# Patient Record
Sex: Male | Born: 2000
Health system: Southern US, Community
[De-identification: ages and names within clinical notes are randomized; demographics above are authoritative.]

## PROBLEM LIST (undated history)

## (undated) DIAGNOSIS — F329 Major depressive disorder, single episode, unspecified: Secondary | ICD-10-CM

## (undated) DIAGNOSIS — F32A Depression, unspecified: Secondary | ICD-10-CM

## (undated) DIAGNOSIS — F12188 Cannabis abuse with other cannabis-induced disorder: Secondary | ICD-10-CM

## (undated) DIAGNOSIS — E669 Obesity, unspecified: Secondary | ICD-10-CM

## (undated) DIAGNOSIS — J45909 Unspecified asthma, uncomplicated: Secondary | ICD-10-CM

## (undated) DIAGNOSIS — E119 Type 2 diabetes mellitus without complications: Secondary | ICD-10-CM

## (undated) DIAGNOSIS — F419 Anxiety disorder, unspecified: Secondary | ICD-10-CM

## (undated) DIAGNOSIS — F4310 Post-traumatic stress disorder, unspecified: Secondary | ICD-10-CM

## (undated) DIAGNOSIS — R440 Auditory hallucinations: Secondary | ICD-10-CM

## (undated) HISTORY — PX: FRACTURE SURGERY: SHX138

---
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
## 2021-01-08 ENCOUNTER — Other Ambulatory Visit (INDEPENDENT_AMBULATORY_CARE_PROVIDER_SITE_OTHER): Payer: Self-pay | Admitting: "Endocrinology

## 2021-01-08 ENCOUNTER — Other Ambulatory Visit: Payer: Self-pay | Admitting: Family Medicine

## 2021-01-08 DIAGNOSIS — E1065 Type 1 diabetes mellitus with hyperglycemia: Secondary | ICD-10-CM

## 2021-01-08 DIAGNOSIS — IMO0002 Reserved for concepts with insufficient information to code with codable children: Secondary | ICD-10-CM

## 2021-02-01 ENCOUNTER — Telehealth (INDEPENDENT_AMBULATORY_CARE_PROVIDER_SITE_OTHER): Payer: Self-pay

## 2021-02-01 ENCOUNTER — Ambulatory Visit (INDEPENDENT_AMBULATORY_CARE_PROVIDER_SITE_OTHER): Payer: Medicaid Other | Admitting: "Endocrinology

## 2021-02-01 NOTE — Progress Notes (Deleted)
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 checking his BGs.  3. Dehydration: He is reasonably hydrated today,  4-5. Goiter/thyroiditis: His thyroid gland is less enlarged today and his thyroiditis is clinically quiescent. He was euthyroid in February 2021. It was likely that he would follow in the footsteps of his maternal grandmother and become hypothyroid in the future. We need to repeat his TFTs. 6-7. Growth delay, physical/unintentional weight loss: His height growth has plateaued. His weight is unchanged.  8. Tinea pedis: His tinea is still active. He needs to use the ketoconazole every day.   9-12. Adjustment reaction/hallucinations/deliberate self-cutting/PTSD:  These problems had been better after starting psych meds and undergoing counseling. Unfortunately, he stopped taking those medications. He says that when he sees his new psychiatrist, he will take whatever medications are prescribed. 13. Peripheral neuropathy: He has no peripheral neuropathy today. 14. Autonomic neuropathy: He still has gastroparesis and post-prandial bloating.  15. Ketonuria: He has ketosis and ketonuria.   PLAN:  1. Diagnostic: TFTs, CBC, microalbumin/creatinine ratio. Send in BG report in 2 days and again in 2 weeks 2. Therapeutic: Take 40 units of Lantus and follow his Novolog 120/30/12 plan.  3. Patient education: We discussed all of the above, to include the need to take better control of his BG. .  4. Follow-up: 2 months  Level of Service: This visit lasted in excess of 90 minutes. More than 50% of the visit was devoted to counseling.   Tillman Sers, MD, CDE Pediatric and Adult  Endocrinology

## 2021-02-01 NOTE — Telephone Encounter (Signed)
Patient had an appointment scheduled to see Dr. Fransico Michael today, but showed up to the office well after the scheduled time. While rescheduling the appointment patient mentioned to the front desk staff that he needed a new meter.  This medical assistant spoke to the patient and he informs that he does not feel that his meter is accurately recording his glucose levels. States he bought a meter from the pharmacy to use until he could come see [place]. Asked patient if he brought his meters with him so we may download. Patient provided meter (did not have the meter he felt was working inadequately), but this was not a meter we were able to download.  Patient was provided a new meter and asked to bring all meters with him to his scheduled appointment so we may download and obtain al information.   Patient also signed a DPR as he is now over the age of 24.

## 2021-02-09 ENCOUNTER — Other Ambulatory Visit (INDEPENDENT_AMBULATORY_CARE_PROVIDER_SITE_OTHER): Payer: Self-pay | Admitting: "Endocrinology

## 2021-02-09 NOTE — Progress Notes (Deleted)
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 taking large doses of insulin haphazardly without checking his BGs.  3. Dehydration: He is reasonably hydrated today,  4-5. Goiter/thyroiditis: His thyroid gland is less enlarged today and his thyroiditis is clinically quiescent. He was euthyroid in February 2021. It was likely that he would follow in the footsteps of his maternal grandmother and become hypothyroid in the future. We need to repeat his TFTs. 6-7. Growth delay, physical/unintentional weight loss: His height growth has plateaued. His weight is unchanged.  8. Tinea pedis: His tinea is still active. He needs to use the ketoconazole every day.   9-12. Adjustment reaction/hallucinations/deliberate self-cutting/PTSD:  These problems had been better after starting psych meds and undergoing counseling. Unfortunately, he stopped taking those medications. He says that when he sees his new psychiatrist, he will take whatever medications are prescribed. 13. Peripheral neuropathy: He has no peripheral neuropathy today. 14. Autonomic neuropathy: He still has gastroparesis and post-prandial bloating.  15. Ketonuria: He has ketosis and ketonuria.   PLAN:  1. Diagnostic: TFTs, CBC, microalbumin/creatinine ratio. Send in BG report in 2 days and again in 2 weeks 2. Therapeutic: Take 40 units of Lantus and follow his Novolog 120/30/12 plan.  3. Patient education: We discussed all of the above, to include the need to take better control of his BG. .  4. Follow-up: 2 months  Level of Service: This visit lasted in excess of 90 minutes. More than  50% of the visit was devoted to counseling.   Tillman Sers, MD, CDE [facility]

## 2021-02-10 ENCOUNTER — Ambulatory Visit (INDEPENDENT_AMBULATORY_CARE_PROVIDER_SITE_OTHER): Payer: Medicaid Other | Admitting: "Endocrinology

## 2021-02-21 ENCOUNTER — Telehealth (INDEPENDENT_AMBULATORY_CARE_PROVIDER_SITE_OTHER): Payer: Self-pay | Admitting: "Endocrinology

## 2021-02-21 NOTE — Telephone Encounter (Signed)
thanks

## 2021-02-21 NOTE — Telephone Encounter (Signed)
Who's calling (name and relationship to patient) : [name] (self)  Best contact number: [phone]  Provider they see: Dr. Fransico Michael  Reason for call:  Keith [name] called in stating that he has been having very high sugars. Last night/early this morning his meter was reading "High", over 500. States this has been continuous. His sugar currently is 126. Has not had any ketones in his urine, has had a headache (States he has a small bump on the side of his head that he has spoken with Dr. Fransico Michael regarding). His meter has not been working properly, checked on his main meter which was reading low but knew it was not accurate, tested on his backup meter which was reading High. Writer scheduled patient in first available with Dr. Fransico Michael which is 7/28. Please advise  Call ID:      PRESCRIPTION REFILL ONLY  Name of prescription:  Pharmacy:

## 2021-02-21 NOTE — Telephone Encounter (Signed)
Returned call: Spoke with Keith [name], checked sugar on old meter read low. Checked again on different meter that read 550. Checked every 30 min started going down. Started last night and went into this morning. His sugar now 126. Checked for Ketones. No ketones. Wants to make a appointment.

## 2021-04-13 NOTE — Progress Notes (Deleted)
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 Hypoglycemia: He reports having some low BG symptoms. In the past he has sometimes developed hypoglycemia after taking large doses of insulin haphazardly without checking his BGs.  3. Dehydration: He is reasonably hydrated today,  4-5. Goiter/thyroiditis: His thyroid gland is less enlarged today and his thyroiditis is clinically quiescent. He was euthyroid in February 2021. It was likely that he would follow in the footsteps of his maternal grandmother and become hypothyroid in the future. We need to repeat his TFTs. 6-7. Growth delay, physical/unintentional weight loss: His height growth has plateaued. His weight is unchanged.  8. Tinea pedis: His tinea is still active. He needs to use the ketoconazole every day.   9-12. Adjustment reaction/hallucinations/deliberate self-cutting/PTSD:  These problems had been better after starting psych meds and undergoing counseling. Unfortunately, he stopped taking those medications. He says that when he sees his new psychiatrist, he will take whatever medications are prescribed. 13. Peripheral neuropathy: He has no peripheral neuropathy today. 14. Autonomic neuropathy: He still has gastroparesis and post-prandial bloating.  15. Ketonuria: He has ketosis and ketonuria.   PLAN:  1. Diagnostic: TFTs, CBC, microalbumin/creatinine ratio. Send in BG report in 2 days and again in 2 weeks 2. Therapeutic: Take 40 units of Lantus and follow his Novolog 120/30/12 plan.  3. Patient education: We discussed all of the above, to include the need to take better control of his BG. .  4. Follow-up: 2 months  Level of Service: This visit lasted in excess of 90 minutes. More than 50% of the visit was devoted to counseling.   Tillman Sers, MD, CDE Pediatric and Adult Endocrinology

## 2021-04-14 ENCOUNTER — Ambulatory Visit (INDEPENDENT_AMBULATORY_CARE_PROVIDER_SITE_OTHER): Payer: Medicaid Other | Admitting: "Endocrinology

## 2021-04-29 ENCOUNTER — Inpatient Hospital Stay (HOSPITAL_COMMUNITY)
Admission: EM | Admit: 2021-04-29 | Discharge: 2021-05-02 | DRG: 638 | Disposition: A | Discharge status: Home / Self Care | Payer: Medicaid Other | Attending: Internal Medicine | Admitting: Internal Medicine

## 2021-04-29 ENCOUNTER — Other Ambulatory Visit: Payer: Self-pay

## 2021-04-29 ENCOUNTER — Encounter (HOSPITAL_COMMUNITY): Payer: Self-pay | Admitting: Emergency Medicine

## 2021-04-29 ENCOUNTER — Telehealth (INDEPENDENT_AMBULATORY_CARE_PROVIDER_SITE_OTHER): Payer: Self-pay | Admitting: "Endocrinology

## 2021-04-29 DIAGNOSIS — Z8249 Family history of ischemic heart disease and other diseases of the circulatory system: Secondary | ICD-10-CM

## 2021-04-29 DIAGNOSIS — F32A Depression, unspecified: Secondary | ICD-10-CM | POA: Diagnosis present

## 2021-04-29 DIAGNOSIS — E871 Hypo-osmolality and hyponatremia: Secondary | ICD-10-CM | POA: Diagnosis present

## 2021-04-29 DIAGNOSIS — E1010 Type 1 diabetes mellitus with ketoacidosis without coma: Secondary | ICD-10-CM | POA: Diagnosis not present

## 2021-04-29 DIAGNOSIS — IMO0002 Reserved for concepts with insufficient information to code with codable children: Secondary | ICD-10-CM

## 2021-04-29 DIAGNOSIS — D72829 Elevated white blood cell count, unspecified: Secondary | ICD-10-CM | POA: Diagnosis not present

## 2021-04-29 DIAGNOSIS — D72828 Other elevated white blood cell count: Secondary | ICD-10-CM | POA: Diagnosis present

## 2021-04-29 DIAGNOSIS — D75839 Thrombocytosis, unspecified: Secondary | ICD-10-CM

## 2021-04-29 DIAGNOSIS — D75838 Other thrombocytosis: Secondary | ICD-10-CM | POA: Diagnosis present

## 2021-04-29 DIAGNOSIS — Z818 Family history of other mental and behavioral disorders: Secondary | ICD-10-CM

## 2021-04-29 DIAGNOSIS — N179 Acute kidney failure, unspecified: Secondary | ICD-10-CM | POA: Diagnosis not present

## 2021-04-29 DIAGNOSIS — F4310 Post-traumatic stress disorder, unspecified: Secondary | ICD-10-CM | POA: Diagnosis present

## 2021-04-29 DIAGNOSIS — E860 Dehydration: Secondary | ICD-10-CM | POA: Diagnosis present

## 2021-04-29 DIAGNOSIS — Z794 Long term (current) use of insulin: Secondary | ICD-10-CM

## 2021-04-29 DIAGNOSIS — E1110 Type 2 diabetes mellitus with ketoacidosis without coma: Secondary | ICD-10-CM | POA: Diagnosis present

## 2021-04-29 DIAGNOSIS — Z20822 Contact with and (suspected) exposure to covid-19: Secondary | ICD-10-CM | POA: Diagnosis present

## 2021-04-29 DIAGNOSIS — Z809 Family history of malignant neoplasm, unspecified: Secondary | ICD-10-CM

## 2021-04-29 DIAGNOSIS — E1065 Type 1 diabetes mellitus with hyperglycemia: Secondary | ICD-10-CM

## 2021-04-29 DIAGNOSIS — Z833 Family history of diabetes mellitus: Secondary | ICD-10-CM

## 2021-04-29 LAB — I-STAT VENOUS BLOOD GAS, ED
Acid-base deficit: 11 mmol/L — ABNORMAL HIGH (ref 0.0–2.0)
Bicarbonate: 13 mmol/L — ABNORMAL LOW (ref 20.0–28.0)
Calcium, Ion: 1.27 mmol/L (ref 1.15–1.40)
HCT: 54 % — ABNORMAL HIGH (ref 39.0–52.0)
Hemoglobin: 18.4 g/dL — ABNORMAL HIGH (ref 13.0–17.0)
O2 Saturation: 87 %
Potassium: 4.8 mmol/L (ref 3.5–5.1)
Sodium: 123 mmol/L — ABNORMAL LOW (ref 135–145)
TCO2: 14 mmol/L — ABNORMAL LOW (ref 22–32)
pCO2, Ven: 25.7 mmHg — ABNORMAL LOW (ref 44.0–60.0)
pH, Ven: 7.312 (ref 7.250–7.430)
pO2, Ven: 56 mmHg — ABNORMAL HIGH (ref 32.0–45.0)

## 2021-04-29 LAB — COMPREHENSIVE METABOLIC PANEL
ALT: 17 U/L (ref 0–44)
AST: 13 U/L — ABNORMAL LOW (ref 15–41)
Albumin: 4.3 g/dL (ref 3.5–5.0)
Alkaline Phosphatase: 86 U/L (ref 38–126)
Anion gap: 18 — ABNORMAL HIGH (ref 5–15)
BUN: 17 mg/dL (ref 6–20)
CO2: 14 mmol/L — ABNORMAL LOW (ref 22–32)
Calcium: 10.3 mg/dL (ref 8.9–10.3)
Chloride: 91 mmol/L — ABNORMAL LOW (ref 98–111)
Creatinine, Ser: 1.12 mg/dL (ref 0.61–1.24)
GFR, Estimated: >60 mL/min (ref >60)
Glucose, Bld: 440 mg/dL — ABNORMAL HIGH (ref 70–99)
Potassium: 4.8 mmol/L (ref 3.5–5.1)
Sodium: 123 mmol/L — ABNORMAL LOW (ref 135–145)
Total Bilirubin: 2 mg/dL — ABNORMAL HIGH (ref 0.3–1.2)
Total Protein: 7.1 g/dL (ref 6.5–8.1)

## 2021-04-29 LAB — RESP PANEL BY RT-PCR (FLU A&B, COVID) ARPGX2
Influenza A by PCR: NEGATIVE
Influenza B by PCR: NEGATIVE
SARS Coronavirus 2 by RT PCR: NEGATIVE

## 2021-04-29 LAB — BASIC METABOLIC PANEL
Anion gap: 20 — ABNORMAL HIGH (ref 5–15)
Anion gap: 11 (ref 5–15)
BUN: 18 mg/dL (ref 6–20)
BUN: 14 mg/dL (ref 6–20)
CO2: 8 mmol/L — ABNORMAL LOW (ref 22–32)
CO2: 15 mmol/L — ABNORMAL LOW (ref 22–32)
Calcium: 8.9 mg/dL (ref 8.9–10.3)
Calcium: 8.8 mg/dL — ABNORMAL LOW (ref 8.9–10.3)
Chloride: 96 mmol/L — ABNORMAL LOW (ref 98–111)
Chloride: 101 mmol/L (ref 98–111)
Creatinine, Ser: 1.16 mg/dL (ref 0.61–1.24)
Creatinine, Ser: 1.04 mg/dL (ref 0.61–1.24)
GFR, Estimated: >60 mL/min (ref >60)
GFR, Estimated: >60 mL/min (ref >60)
Glucose, Bld: 385 mg/dL — ABNORMAL HIGH (ref 70–99)
Glucose, Bld: 248 mg/dL — ABNORMAL HIGH (ref 70–99)
Potassium: 5.6 mmol/L — ABNORMAL HIGH (ref 3.5–5.1)
Potassium: 3.8 mmol/L (ref 3.5–5.1)
Sodium: 124 mmol/L — ABNORMAL LOW (ref 135–145)
Sodium: 127 mmol/L — ABNORMAL LOW (ref 135–145)

## 2021-04-29 LAB — CBC WITH DIFFERENTIAL/PLATELET
Abs Immature Granulocytes: 0.14 10*3/uL — ABNORMAL HIGH (ref 0.00–0.07)
Basophils Absolute: 0.1 10*3/uL (ref 0.0–0.1)
Basophils Relative: 1 %
Eosinophils Absolute: 0 10*3/uL (ref 0.0–0.5)
Eosinophils Relative: 0 %
HCT: 49.1 % (ref 39.0–52.0)
Hemoglobin: 16.4 g/dL (ref 13.0–17.0)
Immature Granulocytes: 1 %
Lymphocytes Relative: 9 %
Lymphs Abs: 1 10*3/uL (ref 0.7–4.0)
MCH: 28.3 pg (ref 26.0–34.0)
MCHC: 33.4 g/dL (ref 30.0–36.0)
MCV: 84.7 fL (ref 80.0–100.0)
Monocytes Absolute: 1.3 10*3/uL — ABNORMAL HIGH (ref 0.1–1.0)
Monocytes Relative: 12 %
Neutro Abs: 8.4 10*3/uL — ABNORMAL HIGH (ref 1.7–7.7)
Neutrophils Relative %: 77 %
Platelets: 402 10*3/uL — ABNORMAL HIGH (ref 150–400)
RBC: 5.8 MIL/uL (ref 4.22–5.81)
RDW: 13 % (ref 11.5–15.5)
WBC: 11 10*3/uL — ABNORMAL HIGH (ref 4.0–10.5)
nRBC: 0 % (ref 0.0–0.2)

## 2021-04-29 LAB — URINALYSIS, ROUTINE W REFLEX MICROSCOPIC
Bacteria, UA: NONE SEEN
Bilirubin Urine: NEGATIVE
Glucose, UA: >=500 mg/dL — AB
Hgb urine dipstick: NEGATIVE
Ketones, ur: 80 mg/dL — AB
Leukocytes,Ua: NEGATIVE
Nitrite: NEGATIVE
Protein, ur: NEGATIVE mg/dL
Specific Gravity, Urine: 1.015 (ref 1.005–1.030)
pH: 5 (ref 5.0–8.0)

## 2021-04-29 LAB — BETA-HYDROXYBUTYRIC ACID
Beta-Hydroxybutyric Acid: 2.26 mmol/L — ABNORMAL HIGH (ref 0.05–0.27)
Beta-Hydroxybutyric Acid: >8 mmol/L — ABNORMAL HIGH (ref 0.05–0.27)

## 2021-04-29 LAB — CBG MONITORING, ED
Glucose-Capillary: 407 mg/dL — ABNORMAL HIGH (ref 70–99)
Glucose-Capillary: 366 mg/dL — ABNORMAL HIGH (ref 70–99)
Glucose-Capillary: 307 mg/dL — ABNORMAL HIGH (ref 70–99)
Glucose-Capillary: 267 mg/dL — ABNORMAL HIGH (ref 70–99)

## 2021-04-29 LAB — LIPASE, BLOOD: Lipase: 28 U/L (ref 11–51)

## 2021-04-29 LAB — GLUCOSE, CAPILLARY: Glucose-Capillary: 246 mg/dL — ABNORMAL HIGH (ref 70–99)

## 2021-04-29 MED ORDER — DEXTROSE IN LACTATED RINGERS 5 % IV SOLN: INTRAVENOUS | Status: DC

## 2021-04-29 MED ORDER — ENOXAPARIN SODIUM 40 MG/0.4ML IJ SOSY
40.0000 mg | PREFILLED_SYRINGE | INTRAMUSCULAR | Status: DC
  Administered 2021-04-29 – 2021-05-01 (×3): 40 mg via SUBCUTANEOUS
  Filled 2021-04-29 (×3): qty 0.4

## 2021-04-29 MED ORDER — METOCLOPRAMIDE HCL 5 MG/ML IJ SOLN
10.0000 mg | Freq: Once | INTRAMUSCULAR | Status: AC
  Administered 2021-04-29: 10 mg via INTRAVENOUS
  Filled 2021-04-29: qty 2

## 2021-04-29 MED ORDER — DEXTROSE 50 % IV SOLN: 0.0000 mL | INTRAVENOUS | Status: DC | PRN

## 2021-04-29 MED ORDER — INSULIN REGULAR(HUMAN) IN NACL 100-0.9 UT/100ML-% IV SOLN
INTRAVENOUS | Status: DC
  Administered 2021-04-29: 7.5 [IU]/h via INTRAVENOUS
  Filled 2021-04-29: qty 100

## 2021-04-29 MED ORDER — LACTATED RINGERS IV SOLN: INTRAVENOUS | Status: DC

## 2021-04-29 MED ORDER — LACTATED RINGERS IV BOLUS
1000.0000 mL | Freq: Once | INTRAVENOUS | Status: AC
  Administered 2021-04-29: 1000 mL via INTRAVENOUS

## 2021-04-29 MED ORDER — POTASSIUM CHLORIDE 10 MEQ/100ML IV SOLN
10.0000 meq | INTRAVENOUS | Status: AC
  Administered 2021-04-29 (×2): 10 meq via INTRAVENOUS
  Filled 2021-04-29 (×2): qty 100

## 2021-04-29 NOTE — ED Notes (Signed)
cbg 407
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
## 2021-04-29 NOTE — ED Triage Notes (Signed)
Pt here from home with c/o abd  pain and n/v ,pt does admit to smoking some weed 2 days ago .

## 2021-04-29 NOTE — Telephone Encounter (Signed)
  Who's calling (name and relationship to patient) :[name]   Best contact number:[phone]  Provider they see:Dr. Fransico Michael   Reason for call:Caller stated that she wanted to call in and let Dr. Fransico Michael know that they are on the way to take Jaxn to [facility] and he will probably be admitted per [facility]... Caller stated that he wont eat, he has been throwing up, and slepping a lot, and has a infection inside his mouth. [name] stated that is health overall is not good and not being cared for like it should. Please advise      PRESCRIPTION REFILL ONLY  Name of prescription:  Pharmacy:

## 2021-04-29 NOTE — H&P (Signed)
History and Physical   Keith [name] JXB:[id number] DOB: 08-Nov-2000 DOA: 04/29/2021  PCP: Aleen Campi, DO   Patient coming from: Home  Chief Complaint: Nausea, vomiting, abdominal pain, fatigue, elevated blood glucose  HPI: Keith [name] is a 20 y.o. male with medical history significant of diabetes type 1, anxiety, depression, PTSD who presents with ongoing nausea vomiting abdominal pain and fatigue. As above patient has had 2 days of nausea, vomiting, abdominal pain, fatigue.  He has noticed that his blood sugars have been running in the 400s.  He states he is trying to eat but gets nausea and vomits whenever he tries to.  He states he has been taking his insulin and has been trying to follow his diabetic diet. He denies fevers, chills, chest pain, shortness of breath, constipation, diarrhea.   ED Course: Vital signs in the ED significant for initial heart rate in the 120s now improved to the 100s.  Lab work-up showed CMP with sodium of 123 which improved to 131 when you account for glucose of 440.  Potassium 4.8, chloride 91, bicarb 14, gap of 18, creatinine 1.12 up from baseline of around 0.7.  T bili 2.  CBC with leukocytosis to 11 and platelets of 4 2.  Lipase normal.  Respiratory panel for flu and COVID pending.  Beta hydroxybutyric acid elevated at 2.26.  Urinalysis pending.  VBG with pH of 7.31 and PCO2 of 25.  Patient started on insulin drip, IV fluids, Reglan and potassium in the ED.  Review of Systems: As per HPI otherwise all other systems reviewed and are negative.  Past Medical History:  Diagnosis Date   Anxiety    Asthma    Auditory hallucinations    Depression    Diabetes mellitus without complication ([place]) 13/04/6577   New onset   Obesity    PTSD (post-traumatic stress disorder)    Social problem 08/03/2015   Unintentional weight loss 06/07/2017    Past Surgical History:  Procedure Laterality Date   FRACTURE SURGERY     Right arm    Social  History  reports that he has never smoked. He has never used smokeless tobacco. He reports current drug use. Drug: Marijuana. He reports that he does not drink alcohol.  Allergies  Allergen Reactions   Amoxicillin Hives and Rash   Penicillins Hives and Rash    Has patient had a PCN reaction causing immediate rash, facial/tongue/throat swelling, SOB or lightheadedness with hypotension: Yes Has patient had a PCN reaction causing severe rash involving mucus membranes or skin necrosis: Yes Has patient had a PCN reaction that required hospitalization: No Has patient had a PCN reaction occurring within the last 10 years: Yes If all of the above answers are "NO", then may proceed with Cephalosporin use.     Family History  Problem Relation Age of Onset   Diabetes Mother    Anxiety disorder Mother    Hypothyroidism Maternal Grandmother    Cancer Maternal Grandmother    Heart disease Maternal Grandmother    Cancer Maternal Grandfather    Hyperlipidemia Maternal Grandfather   Reviewed on admission  Prior to Admission medications   Medication Sig Start Date End Date Taking? Authorizing Provider  Accu-Chek FastClix Lancets MISC TEST SIX TIMES DAILY 01/10/21   Sherrlyn Hock, MD  BD PEN NEEDLE NANO 2ND GEN 32G X 4 MM MISC USE 1 NEEDLE SIX TIMES DAILY 01/10/21   Sherrlyn Hock, MD  Blood Glucose Monitoring Suppl (ACCU-CHEK GUIDE) w/Device KIT 1  Units by Does not apply route daily. Patient not taking: No sig reported 05/04/17   Sherrlyn Hock, MD  Blood Glucose Monitoring Suppl (ACCU-CHEK GUIDE) w/Device KIT Use to check blood glucose 8 times a day Patient not taking: Reported on 11/30/2020 09/16/20   Sherrlyn Hock, MD  Glucagon (BAQSIMI TWO PACK) 3 MG/DOSE POWD Place 1 application into the nose as needed. Use as directed if unconscious, unable to take food po, or having a seizure due to hypoglycemia Patient not taking: Reported on 11/30/2020 02/04/20   Levon Hedger, MD   glucagon (GLUCAGON EMERGENCY) 1 MG injection INJECT 1 MG IN THE MUSCLE OF THIGH IF UNRESPONSIVE, UNABLE TO SWALLOW, UNCONSCIOUS AND/OR HAS SEIZURE Patient not taking: Reported on 11/30/2020 08/11/19   Sherrlyn Hock, MD  glucose blood (ACCU-CHEK GUIDE) test strip Test blood sugar 8 times daily Patient not taking: Reported on 11/30/2020 09/16/20   Sherrlyn Hock, MD  insulin aspart (NOVOLOG FLEXPEN) 100 UNIT/ML FlexPen Use up to 50 units daily 02/09/21   Sherrlyn Hock, MD  insulin aspart (NOVOLOG) 100 UNIT/ML injection Inject into skin 3 times daily before meals per sliding scale 09/16/20   Sherrlyn Hock, MD  insulin glargine (LANTUS SOLOSTAR) 100 UNIT/ML Solostar Pen Inject up to 50 units per day 05/31/20   Sherrlyn Hock, MD  ketoconazole (NIZORAL) 2 % cream APPLY TO FEET TWICE DAILY FOR 1 MONTH THEN ONCE DAILY UNTIL FUNGUS CLEARS COMPLETELY 02/09/21   Sherrlyn Hock, MD  Urine Glucose-Ketones Test STRP 1 each by Other route as needed. Patient not taking: No sig reported 09/08/15   Lelon Huh, MD    Physical Exam: Vitals:   04/29/21 1815 04/29/21 1830 04/29/21 1845 04/29/21 1930  BP: 120/89 122/86 112/76 107/75  Pulse: (!) 106 (!) 102 (!) 106 (!) 103  Resp: '15 15 19 14  ' Temp:      TempSrc:      SpO2: 99% 100% 100% 100%   Physical Exam Constitutional:      General: He is not in acute distress.    Comments: Lethargic/tired appearing young male  HENT:     Head: Normocephalic and atraumatic.     Mouth/Throat:     Mouth: Mucous membranes are moist.     Pharynx: Oropharynx is clear.  Eyes:     Extraocular Movements: Extraocular movements intact.     Pupils: Pupils are equal, round, and reactive to light.  Cardiovascular:     Rate and Rhythm: Regular rhythm. Tachycardia present.     Pulses: Normal pulses.     Heart sounds: Normal heart sounds.  Pulmonary:     Effort: Pulmonary effort is normal. No respiratory distress.     Breath sounds: Normal breath  sounds.  Abdominal:     General: Bowel sounds are normal. There is no distension.     Palpations: Abdomen is soft.     Tenderness: There is no abdominal tenderness.  Musculoskeletal:        General: No swelling or deformity.  Skin:    General: Skin is warm and dry.  Neurological:     General: No focal deficit present.     Mental Status: Mental status is at baseline.   Labs on Admission: I have personally reviewed following labs and imaging studies  CBC: Recent Labs  Lab 04/29/21 1710 04/29/21 1732  WBC 11.0*  --   NEUTROABS 8.4*  --   HGB 16.4 18.4*  HCT 49.1 54.0*  MCV 84.7  --  PLT 402*  --     Basic Metabolic Panel: Recent Labs  Lab 04/29/21 1710 04/29/21 1732  NA 123* 123*  K 4.8 4.8  CL 91*  --   CO2 14*  --   GLUCOSE 440*  --   BUN 17  --   CREATININE 1.12  --   CALCIUM 10.3  --     GFR: CrCl cannot be calculated (Unknown ideal weight.).  Liver Function Tests: Recent Labs  Lab 04/29/21 1710  AST 13*  ALT 17  ALKPHOS 86  BILITOT 2.0*  PROT 7.1  ALBUMIN 4.3    Urine analysis:    Component Value Date/Time   COLORURINE YELLOW 11/28/2020 1241   APPEARANCEUR CLEAR 11/28/2020 1241   LABSPEC 1.037 (H) 11/28/2020 1241   PHURINE 5.0 11/28/2020 1241   GLUCOSEU >=500 (A) 11/28/2020 1241   HGBUR NEGATIVE 11/28/2020 1241   BILIRUBINUR NEGATIVE 11/28/2020 1241   KETONESUR 80 (A) 11/28/2020 1241   PROTEINUR Negative 11/30/2020 1010   PROTEINUR NEGATIVE 11/28/2020 1241   UROBILINOGEN 1.0 08/07/2013 1528   NITRITE NEGATIVE 11/28/2020 1241   LEUKOCYTESUR NEGATIVE 11/28/2020 1241    Radiological Exams on Admission: No results found.  EKG: Independently reviewed.  Sinus tachycardia at 105 bpm.  Assessment/Plan Principal Problem:   DKA (diabetic ketoacidosis) ([place]) Active Problems:   AKI (acute kidney injury) ([place])   Hyponatremia   Leukocytosis   Thrombocytosis  DKA > Type I diabetic with 2 days of nausea, vomiting, abdominal pain and  fatigue with home blood sugars run in the 400s. > Reports he is continue take his insulin and following a diabetic diet.  He continues follows with pediatric endocrinologist Dr. Signa Kell.  Reports home insulin dose is 40 units in the evening. > Glucose in the ED 440 with bicarb of 14 and gap of 18.  Beta hydroxybutyric acid elevated to 2.2.6. > Started on DKA protocol in ED including insulin drip, potassium, trending labs. - Admit to progressive - Continue insulin drip per protocol - Potassium supplementation ongoing in the ED, 20 mEq IV over 2 hours - Additional 1L bolus. LR at 125 mL/hr until CBG less than 250 - Switch to D5-LR when 1 CBG less than 250 - Nothing by mouth  - BMET every 4 hours - CBG Q1H - Once anion gap closed 2, start CM diet and if able to eat, administer Lantus 30 units - Continue insulin drip for 1-2 more hours, then discontinue and start SSI-S  - DC fluids if eating, drinking, and off insulin drip  Hyponatremia AKI > Sodium 123 which when corrected for glucose of 440 is still mildly low at 131.  Likely due to dehydration. > Creatinine 1.12 up from 0.7 which thinks her trigger for AKI.  Also in the setting of dehydration and DKA. - Expect sodium to improve with IV fluids. - Continue with IV fluids - Avoid nephrotoxic agents - Trend renal function and electrolytes  Leukocytosis Thrombocytosis > Hemoglobin 11 and platelets greater than 400.  Suspect both are reactive secondary to DKA. - Continue to monitor  DVT prophylaxis: Lovenox  Code Status:   Full  Family Communication:  Grandfather updated at bedside Disposition Plan:   Patient is from:  Home  Anticipated DC to:  Home  Anticipated DC date:  1 to 2 days  Anticipated DC barriers: None  Consults called:  None Admission status:  Observation, progressive   Severity of Illness: The appropriate patient status for this patient is OBSERVATION. Observation status is  judged to be reasonable and necessary in  order to provide the required intensity of service to ensure the patient's safety. The patient's presenting symptoms, physical exam findings, and initial radiographic and laboratory data in the context of their medical condition is felt to place them at decreased risk for further clinical deterioration. Furthermore, it is anticipated that the patient will be medically stable for discharge from the hospital within 2 midnights of admission. The following factors support the patient status of observation.   " The patient's presenting symptoms include nausea, vomiting, abdominal pain, fatigue. " The physical exam findings include tachycardia, lethargy. " The initial radiographic and laboratory data are  Lab work-up showed CMP with sodium of 123 which improved to 131 when you account for glucose of 440.  Potassium 4.8, chloride 91, bicarb 14, gap of 18, creatinine 1.12 up from baseline of around 0.7.  T bili 2.  CBC with leukocytosis to 11 and platelets of 4 2.  Lipase normal.  Respiratory panel for flu and COVID pending.  Beta hydroxybutyric acid elevated at 2.26.  Urinalysis pending.  VBG with pH of 7.31 and PCO2 of 25.   Marcelyn Bruins MD [facility]  How to contact the [facility] Attending or Consulting provider [place] or covering provider during after hours [place], for this patient?   Check the care team in [facility] and look for a) attending/consulting TRH provider listed and b) the [facility] team listed Log into [email or web address] and use [place]'s universal password to access. If you do not have the password, please contact the hospital operator. Locate the [facility] provider you are looking for under [facility] and page to a number that you can be directly reached. If you still have difficulty reaching the provider, please page the [employer] (Director on Call) for the Hospitalists listed on amion for assistance.  04/29/2021, 8:12 PM

## 2021-04-29 NOTE — ED Provider Notes (Signed)
Emergency Medicine Provider Triage Evaluation Note  Keith [name] , a 20 y.o. male  was evaluated in triage.  Pt complains of nv for 2 days. Also c/o diffuse abd pain.  Review of Systems  Positive: Abd pain, nv Negative: fever  Physical Exam  There were no vitals taken for this visit. Gen:   Awake, no distress   Resp:  Normal effort  MSK:   Moves extremities without difficulty  Other:  Diffuse abd discomfort  Medical Decision Making  Medically screening exam initiated at 4:58 PM.  Appropriate orders placed.  Jerris Fleer was informed that the remainder of the evaluation will be completed by another provider, this initial triage assessment does not replace that evaluation, and the importance of remaining in the ED until their evaluation is complete.     Rayne Du 04/29/21 1707    Terrilee Files, MD 04/30/21 1102

## 2021-04-30 DIAGNOSIS — Z8249 Family history of ischemic heart disease and other diseases of the circulatory system: Secondary | ICD-10-CM | POA: Diagnosis not present

## 2021-04-30 DIAGNOSIS — N179 Acute kidney failure, unspecified: Secondary | ICD-10-CM | POA: Diagnosis present

## 2021-04-30 DIAGNOSIS — Z20822 Contact with and (suspected) exposure to covid-19: Secondary | ICD-10-CM | POA: Diagnosis present

## 2021-04-30 DIAGNOSIS — Z818 Family history of other mental and behavioral disorders: Secondary | ICD-10-CM | POA: Diagnosis not present

## 2021-04-30 DIAGNOSIS — E860 Dehydration: Secondary | ICD-10-CM | POA: Diagnosis present

## 2021-04-30 DIAGNOSIS — F4310 Post-traumatic stress disorder, unspecified: Secondary | ICD-10-CM | POA: Diagnosis present

## 2021-04-30 DIAGNOSIS — F32A Depression, unspecified: Secondary | ICD-10-CM | POA: Diagnosis present

## 2021-04-30 DIAGNOSIS — E1010 Type 1 diabetes mellitus with ketoacidosis without coma: Secondary | ICD-10-CM | POA: Diagnosis present

## 2021-04-30 DIAGNOSIS — D75838 Other thrombocytosis: Secondary | ICD-10-CM | POA: Diagnosis present

## 2021-04-30 DIAGNOSIS — Z833 Family history of diabetes mellitus: Secondary | ICD-10-CM | POA: Diagnosis not present

## 2021-04-30 DIAGNOSIS — D72828 Other elevated white blood cell count: Secondary | ICD-10-CM | POA: Diagnosis present

## 2021-04-30 DIAGNOSIS — Z809 Family history of malignant neoplasm, unspecified: Secondary | ICD-10-CM | POA: Diagnosis not present

## 2021-04-30 DIAGNOSIS — Z794 Long term (current) use of insulin: Secondary | ICD-10-CM | POA: Diagnosis not present

## 2021-04-30 DIAGNOSIS — E871 Hypo-osmolality and hyponatremia: Secondary | ICD-10-CM | POA: Diagnosis present

## 2021-04-30 LAB — GLUCOSE, CAPILLARY
Glucose-Capillary: 126 mg/dL — ABNORMAL HIGH (ref 70–99)
Glucose-Capillary: 136 mg/dL — ABNORMAL HIGH (ref 70–99)
Glucose-Capillary: 140 mg/dL — ABNORMAL HIGH (ref 70–99)
Glucose-Capillary: 141 mg/dL — ABNORMAL HIGH (ref 70–99)
Glucose-Capillary: 163 mg/dL — ABNORMAL HIGH (ref 70–99)
Glucose-Capillary: 165 mg/dL — ABNORMAL HIGH (ref 70–99)
Glucose-Capillary: 170 mg/dL — ABNORMAL HIGH (ref 70–99)
Glucose-Capillary: 341 mg/dL — ABNORMAL HIGH (ref 70–99)
Glucose-Capillary: 342 mg/dL — ABNORMAL HIGH (ref 70–99)
Glucose-Capillary: 344 mg/dL — ABNORMAL HIGH (ref 70–99)
Glucose-Capillary: 375 mg/dL — ABNORMAL HIGH (ref 70–99)
Glucose-Capillary: 415 mg/dL — ABNORMAL HIGH (ref 70–99)

## 2021-04-30 LAB — BASIC METABOLIC PANEL
Anion gap: 7 (ref 5–15)
Anion gap: 5 (ref 5–15)
Anion gap: 6 (ref 5–15)
BUN: 10 mg/dL (ref 6–20)
BUN: 11 mg/dL (ref 6–20)
BUN: 12 mg/dL (ref 6–20)
CO2: 21 mmol/L — ABNORMAL LOW (ref 22–32)
CO2: 22 mmol/L (ref 22–32)
CO2: 23 mmol/L (ref 22–32)
Calcium: 8.7 mg/dL — ABNORMAL LOW (ref 8.9–10.3)
Calcium: 8.8 mg/dL — ABNORMAL LOW (ref 8.9–10.3)
Calcium: 8.4 mg/dL — ABNORMAL LOW (ref 8.9–10.3)
Chloride: 104 mmol/L (ref 98–111)
Chloride: 105 mmol/L (ref 98–111)
Chloride: 102 mmol/L (ref 98–111)
Creatinine, Ser: 0.69 mg/dL (ref 0.61–1.24)
Creatinine, Ser: 0.6 mg/dL — ABNORMAL LOW (ref 0.61–1.24)
Creatinine, Ser: 0.62 mg/dL (ref 0.61–1.24)
GFR, Estimated: >60 mL/min (ref >60)
GFR, Estimated: >60 mL/min (ref >60)
GFR, Estimated: >60 mL/min (ref >60)
Glucose, Bld: 143 mg/dL — ABNORMAL HIGH (ref 70–99)
Glucose, Bld: 132 mg/dL — ABNORMAL HIGH (ref 70–99)
Glucose, Bld: 381 mg/dL — ABNORMAL HIGH (ref 70–99)
Potassium: 3.7 mmol/L (ref 3.5–5.1)
Potassium: 3.5 mmol/L (ref 3.5–5.1)
Potassium: 3.4 mmol/L — ABNORMAL LOW (ref 3.5–5.1)
Sodium: 132 mmol/L — ABNORMAL LOW (ref 135–145)
Sodium: 132 mmol/L — ABNORMAL LOW (ref 135–145)
Sodium: 131 mmol/L — ABNORMAL LOW (ref 135–145)

## 2021-04-30 LAB — BETA-HYDROXYBUTYRIC ACID
Beta-Hydroxybutyric Acid: 0.84 mmol/L — ABNORMAL HIGH (ref 0.05–0.27)
Beta-Hydroxybutyric Acid: 0.4 mmol/L — ABNORMAL HIGH (ref 0.05–0.27)

## 2021-04-30 MED ORDER — INSULIN GLARGINE-YFGN 100 UNIT/ML ~~LOC~~ SOLN
30.0000 [IU] | Freq: Every day | SUBCUTANEOUS | Status: DC
  Administered 2021-04-30: 30 [IU] via SUBCUTANEOUS
  Filled 2021-04-30 (×2): qty 0.3

## 2021-04-30 MED ORDER — INSULIN GLARGINE-YFGN 100 UNIT/ML ~~LOC~~ SOLN
10.0000 [IU] | Freq: Once | SUBCUTANEOUS | Status: AC
  Administered 2021-04-30: 10 [IU] via SUBCUTANEOUS
  Filled 2021-04-30: qty 0.1

## 2021-04-30 MED ORDER — INSULIN ASPART 100 UNIT/ML IJ SOLN
0.0000 [IU] | Freq: Three times a day (TID) | INTRAMUSCULAR | Status: DC
  Administered 2021-04-30 (×2): 7 [IU] via SUBCUTANEOUS
  Administered 2021-05-01: 5 [IU] via SUBCUTANEOUS
  Administered 2021-05-01: 2 [IU] via SUBCUTANEOUS
  Administered 2021-05-01: 9 [IU] via SUBCUTANEOUS
  Administered 2021-05-02: 3 [IU] via SUBCUTANEOUS
  Administered 2021-05-02: 5 [IU] via SUBCUTANEOUS
  Administered 2021-05-02: 3 [IU] via SUBCUTANEOUS

## 2021-04-30 NOTE — Progress Notes (Signed)
Inpatient Diabetes Program Recommendations  AACE/ADA: New Consensus Statement on Inpatient Glycemic Control (2015)  Target Ranges:  Prepandial:   less than 140 mg/dL      [place] postprandial:   less than 180 mg/dL (1-2 hours)      Critically ill patients:  140 - 180 mg/dL   Lab Results  Component Value Date   GLUCAP 342 (H) 04/30/2021   HGBA1C >14 11/30/2020    Review of Glycemic Control  Diabetes history: DM1 Outpatient Diabetes medications: Lantus up to 50 units/day, Novolog s/s TID Current orders for Inpatient glycemic control: Semglee 40 units QD, Novolog 0-9 units TID with meals + 6 units TID  HgbA1C - pending. Last one 11/30/20 -  > 14%  Inpatient Diabetes Program Recommendations:    Add Novolog HS correction Increase Novolog to 8 units TID with meals if eating > 50% meal.  Diabetes Coordinator will see pt in am.  Continue to follow glucose trends.  Thank you. Ailene Ards, RD, LDN, CDE Inpatient Diabetes Coordinator [phone]

## 2021-05-01 LAB — BASIC METABOLIC PANEL
Anion gap: 7 (ref 5–15)
BUN: 10 mg/dL (ref 6–20)
CO2: 26 mmol/L (ref 22–32)
Calcium: 8.7 mg/dL — ABNORMAL LOW (ref 8.9–10.3)
Chloride: 99 mmol/L (ref 98–111)
Creatinine, Ser: 0.59 mg/dL — ABNORMAL LOW (ref 0.61–1.24)
GFR, Estimated: >60 mL/min (ref >60)
Glucose, Bld: 412 mg/dL — ABNORMAL HIGH (ref 70–99)
Potassium: 3.6 mmol/L (ref 3.5–5.1)
Sodium: 132 mmol/L — ABNORMAL LOW (ref 135–145)

## 2021-05-01 LAB — GLUCOSE, CAPILLARY
Glucose-Capillary: 81 mg/dL (ref 70–99)
Glucose-Capillary: 252 mg/dL — ABNORMAL HIGH (ref 70–99)
Glucose-Capillary: 225 mg/dL — ABNORMAL HIGH (ref 70–99)
Glucose-Capillary: 383 mg/dL — ABNORMAL HIGH (ref 70–99)
Glucose-Capillary: 184 mg/dL — ABNORMAL HIGH (ref 70–99)
Glucose-Capillary: 215 mg/dL — ABNORMAL HIGH (ref 70–99)

## 2021-05-01 MED ORDER — INSULIN GLARGINE-YFGN 100 UNIT/ML ~~LOC~~ SOLN: 40.0000 [IU] | Freq: Every day | SUBCUTANEOUS | Status: DC

## 2021-05-01 MED ORDER — INSULIN ASPART 100 UNIT/ML IJ SOLN
0.0000 [IU] | Freq: Every day | INTRAMUSCULAR | Status: DC
  Administered 2021-05-01: 2 [IU] via SUBCUTANEOUS

## 2021-05-01 MED ORDER — INSULIN GLARGINE-YFGN 100 UNIT/ML ~~LOC~~ SOLN
36.0000 [IU] | Freq: Every day | SUBCUTANEOUS | Status: DC
  Administered 2021-05-01: 36 [IU] via SUBCUTANEOUS
  Filled 2021-05-01: qty 0.36

## 2021-05-01 MED ORDER — INSULIN GLARGINE-YFGN 100 UNIT/ML ~~LOC~~ SOLN
40.0000 [IU] | Freq: Every day | SUBCUTANEOUS | Status: DC
  Administered 2021-05-02: 40 [IU] via SUBCUTANEOUS
  Filled 2021-05-01 (×2): qty 0.4

## 2021-05-01 MED ORDER — INSULIN ASPART 100 UNIT/ML IJ SOLN: 0.0000 [IU] | Freq: Three times a day (TID) | INTRAMUSCULAR | Status: DC

## 2021-05-01 MED ORDER — INSULIN ASPART 100 UNIT/ML IJ SOLN
6.0000 [IU] | Freq: Three times a day (TID) | INTRAMUSCULAR | Status: DC
  Administered 2021-05-01 (×2): 6 [IU] via SUBCUTANEOUS

## 2021-05-01 MED ORDER — INSULIN ASPART 100 UNIT/ML IJ SOLN
15.0000 [IU] | Freq: Once | INTRAMUSCULAR | Status: AC
  Administered 2021-05-01: 15 [IU] via SUBCUTANEOUS

## 2021-05-01 NOTE — Progress Notes (Signed)
PROGRESS NOTE    Keith [name]   MLY:[id number]  DOB: Apr 11, 2001  PCP: Sela Hilding, DO    DOA: 04/29/2021 LOS: 1   Assessment & Plan   Principal Problem:   DKA (diabetic ketoacidosis) ([facility]) Active Problems:   AKI (acute kidney injury) ([facility])   Hyponatremia   Leukocytosis   Thrombocytosis   DKA - Resolved, gap closed and off insulin drip Type 1 Diabetes Hyperglycemia --Continue Lantus and Novolog --Adjust insulin for inpatient goal 140-180 Sugars still running high but gap remains closed. --Increasing Lantus and added schedule mealtime Novolog  --Continue sliding scale Novolog  Hyponatremia / Pseudohyponatremia - Improved. Due to hyperglycemia and also N/V with poor PO intake. --follow BMP's --Off IV fluids  Leukocytosis / Thrombocytosis - reactive due to DKA. --follow CBC's   Patient BMI: Body mass index is 17.56 kg/m.   DVT prophylaxis: enoxaparin (LOVENOX) injection 40 mg Start: 04/29/21 2100   Diet:  Diet Orders (From admission, onward)     Start     Ordered   04/30/21 0750  Diet Carb Modified Fluid consistency: Thin; Room service appropriate? Yes  Diet effective now       Question Answer Comment  Diet-HS Snack? Nothing   Calorie Level Medium 1600-2000   Fluid consistency: Thin   Room service appropriate? Yes      04/30/21 0749              Code Status: Full Code   Brief Narrative / Hospital Course to Date:   20 y.o male with type 1 diabetes, anxiety, depression and PTSD, admitted on evening of 04/29/21 with DKA.  Off insulin drip and DKA has resolved, but with glucose still running in 200-300's.  Gap remains closed.    Subjective 05/01/21    Pt says feeling well.  Denies N/V or abdominal pain.  Eating and drinking well.  He denies any acute complaints or needs at this time.  We discussed getting sugars better controlled and hopeful for discharge tomorrow.    Disposition Plan & Communication   Status is: Inpatient  Remains  inpatient appropriate because:Inpatient level of care appropriate due to severity of illness  Dispo: The patient is from: Home              Anticipated d/c is to: Home              Patient currently is not medically stable to d/c.   Difficult to place patient No   Consults, Procedures, Significant Events   Consultants:  None  Procedures:  None  Antimicrobials:  Anti-infectives (From admission, onward)    None         Micro    Objective   Vitals:   04/30/21 2055 04/30/21 2321 05/01/21 0348 05/01/21 0738  BP: 115/66 102/66 108/60 100/67  Pulse: (!) 102 86 87 79  Resp: 19 15 17 16   Temp: 98.5 F (36.9 C) 98.3 F (36.8 C) 98.7 F (37.1 C) 98.2 F (36.8 C)  TempSrc: Oral Oral Oral Oral  SpO2:  100% 99%   Weight:      Height:        Intake/Output Summary (Last 24 hours) at 05/01/2021 0821 Last data filed at 04/30/2021 1100 Gross per 24 hour  Intake 240 ml  Output --  Net 240 ml   Filed Weights   04/29/21 2302  Weight: 46.4 kg    Physical Exam:  General exam: awake, alert, no acute distress HEENT: atraumatic, clear conjunctiva, anicteric sclera,moist  mucus membranes, hearing grossly normal  Respiratory system: CTAB, no wheezes, rales or rhonchi, normal respiratory effort. Cardiovascular system: normal S1/S2, RRR, no JVD, murmurs, rubs, gallops, no pedal edema.   Gastrointestinal system: soft, NT, ND, no HSM felt, +bowel sounds. Central nervous system: A&O x4. no gross focal neurologic deficits, normal speech Extremities: moves all, no edema, normal tone Skin: dry, intact, normal temperature, normal color, no rashes, lesions or ulcers Psychiatry: normal mood, congruent affect, judgement and insight appear normal  Labs   Data Reviewed: I have personally reviewed following labs and imaging studies  CBC: Recent Labs  Lab 04/29/21 1710 04/29/21 1732  WBC 11.0*  --   NEUTROABS 8.4*  --   HGB 16.4 18.4*  HCT 49.1 54.0*  MCV 84.7  --   PLT 402*  --     Basic Metabolic Panel: Recent Labs  Lab 04/29/21 1935 04/29/21 2316 04/30/21 0420 04/30/21 0607 04/30/21 1143  NA 124* 127* 132* 132* 131*  K 5.6* 3.8 3.7 3.5 3.4*  CL 96* 101 104 105 102  CO2 8* 15* 21* 22 23  GLUCOSE 385* 248* 143* 132* 381*  BUN 18 14 10 11 12   CREATININE 1.16 1.04 0.69 0.60* 0.62  CALCIUM 8.9 8.8* 8.7* 8.8* 8.4*   GFR: Estimated Creatinine Clearance: 98.3 mL/min (by C-G formula based on SCr of 0.62 mg/dL). Liver Function Tests: Recent Labs  Lab 04/29/21 1710  AST 13*  ALT 17  ALKPHOS 86  BILITOT 2.0*  PROT 7.1  ALBUMIN 4.3   Recent Labs  Lab 04/29/21 1710  LIPASE 28   No results for input(s): AMMONIA in the last 168 hours. Coagulation Profile: No results for input(s): INR, PROTIME in the last 168 hours. Cardiac Enzymes: No results for input(s): CKTOTAL, CKMB, CKMBINDEX, TROPONINI in the last 168 hours. BNP (last 3 results) No results for input(s): PROBNP in the last 8760 hours. HbA1C: No results for input(s): HGBA1C in the last 72 hours. CBG: Recent Labs  Lab 04/30/21 2104 04/30/21 2326 05/01/21 0401 05/01/21 0601 05/01/21 0737  GLUCAP 375* 415* 81 252* 225*   Lipid Profile: No results for input(s): CHOL, HDL, LDLCALC, TRIG, CHOLHDL, LDLDIRECT in the last 72 hours. Thyroid Function Tests: No results for input(s): TSH, T4TOTAL, FREET4, T3FREE, THYROIDAB in the last 72 hours. Anemia Panel: No results for input(s): VITAMINB12, FOLATE, FERRITIN, TIBC, IRON, RETICCTPCT in the last 72 hours. Sepsis Labs: No results for input(s): PROCALCITON, LATICACIDVEN in the last 168 hours.  Recent Results (from the past 240 hour(s))  Resp Panel by RT-PCR (Flu A&B, Covid) Nasopharyngeal Swab     Status: None   Collection Time: 04/29/21  6:45 PM   Specimen: Nasopharyngeal Swab; Nasopharyngeal(NP) swabs in vial transport medium  Result Value Ref Range Status   SARS Coronavirus 2 by RT PCR NEGATIVE NEGATIVE Final    Comment: (NOTE) SARS-CoV-2  target nucleic acids are NOT DETECTED.  The SARS-CoV-2 RNA is generally detectable in upper respiratory specimens during the acute phase of infection. The lowest concentration of SARS-CoV-2 viral copies this assay can detect is 138 copies/mL. A negative result does not preclude SARS-Cov-2 infection and should not be used as the sole basis for treatment or other patient management decisions. A negative result may occur with  improper specimen collection/handling, submission of specimen other than nasopharyngeal swab, presence of viral mutation(s) within the areas targeted by this assay, and inadequate number of viral copies(<138 copies/mL). A negative result must be combined with clinical observations, patient history, and epidemiological  information. The expected result is Negative.  Fact Sheet for Patients:  BloggerCourse.com  Fact Sheet for Healthcare Providers:  SeriousBroker.it  This test is no t yet approved or cleared by the [place] FDA and  has been authorized for detection and/or diagnosis of SARS-CoV-2 by FDA under an Emergency Use Authorization (EUA). This EUA will remain  in effect (meaning this test can be used) for the duration of the COVID-19 declaration under Section 564(b)(1) of the Act, 21 U.S.C.section 360bbb-3(b)(1), unless the authorization is terminated  or revoked sooner.       Influenza A by PCR NEGATIVE NEGATIVE Final   Influenza B by PCR NEGATIVE NEGATIVE Final    Comment: (NOTE) The Xpert Xpress SARS-CoV-2/FLU/RSV plus assay is intended as an aid in the diagnosis of influenza from Nasopharyngeal swab specimens and should not be used as a sole basis for treatment. Nasal washings and aspirates are unacceptable for Xpert Xpress SARS-CoV-2/FLU/RSV testing.  Fact Sheet for Patients: BloggerCourse.com  Fact Sheet for Healthcare  Providers: SeriousBroker.it  This test is not yet approved or cleared by the [place] FDA and has been authorized for detection and/or diagnosis of SARS-CoV-2 by FDA under an Emergency Use Authorization (EUA). This EUA will remain in effect (meaning this test can be used) for the duration of the COVID-19 declaration under Section 564(b)(1) of the Act, 21 U.S.C. section 360bbb-3(b)(1), unless the authorization is terminated or revoked.  Performed at [facility], [address], [place], [place] [address]       Imaging Studies   No results found.   Medications   Scheduled Meds:  enoxaparin (LOVENOX) injection  40 mg Subcutaneous Q24H   insulin aspart  0-5 Units Subcutaneous QHS   insulin aspart  0-9 Units Subcutaneous TID WC   insulin aspart  0-9 Units Subcutaneous TID WC   insulin glargine-yfgn  30 Units Subcutaneous Daily   Continuous Infusions:     LOS: 1 day    Time spent: 30 minutes    Pennie Banter, DO [facility]  05/01/2021, 8:21 AM      If 7PM-7AM, please contact night-coverage. How to contact the [facility] Attending or Consulting provider 7A - 7P or covering provider during after hours 7P -7A, for this patient?    Check the care team in [facility] and look for a) attending/consulting TRH provider listed and b) the [facility] team listed Log into [email or web address] and use [place]'s universal password to access. If you do not have the password, please contact the hospital operator. Locate the [facility] provider you are looking for under [facility] and page to a number that you can be directly reached. If you still have difficulty reaching the provider, please page the [facility] (Director on Call) for the Hospitalists listed on amion for assistance.

## 2021-05-01 NOTE — Progress Notes (Signed)
PROGRESS NOTE    Keith [name]   OZD:[id number]  DOB: 05-Sep-2001  PCP: Sela Hilding, DO    DOA: 04/29/2021 LOS: 1   Assessment & Plan   Principal Problem:   DKA (diabetic ketoacidosis) (HCC) Active Problems:   AKI (acute kidney injury) (HCC)   Hyponatremia   Leukocytosis   Thrombocytosis   DKA - Resolved, gap closed and off insulin drip Type 1 Diabetes Hyperglycemia --Continue Lantus and Novolog --Adjust insulin for inpatient goal 140-180 Sugars still running high but gap remains closed. --Increasing Lantus and added schedule mealtime Novolog  --Continue sliding scale Novolog  Hyponatremia / Pseudohyponatremia - Improved. Due to hyperglycemia and also N/V with poor PO intake. --follow BMP's --Off IV fluids  Leukocytosis / Thrombocytosis - reactive due to DKA. --follow CBC's   Patient BMI: Body mass index is 17.56 kg/m.   DVT prophylaxis: enoxaparin (LOVENOX) injection 40 mg Start: 04/29/21 2100   Diet:  Diet Orders (From admission, onward)     Start     Ordered   05/01/21 1754  Diet Carb Modified Fluid consistency: Thin; Room service appropriate? Yes with Assist  Diet effective now       Question Answer Comment  Diet-HS Snack? Nothing   Calorie Level Medium 1600-2000   Fluid consistency: Thin   Room service appropriate? Yes with Assist      05/01/21 1753              Code Status: Full Code   Brief Narrative / Hospital Course to Date:   20 y.o male with type 1 diabetes, anxiety, depression and PTSD, admitted on evening of 04/29/21 with DKA.  Off insulin drip and DKA has resolved, but with glucose still running in 200-300's.  Gap remains closed.    Subjective 05/01/21    Pt reports he feels well.  He has not had any further abdominal pain nausea or vomiting since DKA resolved.  His sugars are still high and we discussed his home regimen and improving glycemic control prior to discharge so he does not go back into DKA.  He expresses  understanding appreciation.  He denies any acute complaints today and tolerating food and drink well.   Disposition Plan & Communication   Status is: Inpatient  Remains inpatient appropriate because:Inpatient level of care appropriate due to severity of illness  Dispo: The patient is from: Home              Anticipated d/c is to: Home              Patient currently is not medically stable to d/c.   Difficult to place patient No   Consults, Procedures, Significant Events   Consultants:  None  Procedures:  None  Antimicrobials:  Anti-infectives (From admission, onward)    None         Micro    Objective   Vitals:   05/01/21 0348 05/01/21 0738 05/01/21 1137 05/01/21 1632  BP: 108/60 100/67 (!) 100/56 102/62  Pulse: 87 79 73 82  Resp: 17 16 14 17   Temp: 98.7 F (37.1 C) 98.2 F (36.8 C) 98.2 F (36.8 C) 99.2 F (37.3 C)  TempSrc: Oral Oral Oral Oral  SpO2: 99%     Weight:      Height:        Intake/Output Summary (Last 24 hours) at 05/01/2021 1845 Last data filed at 05/01/2021 1402 Gross per 24 hour  Intake 720 ml  Output --  Net 720 ml  Filed Weights   04/29/21 2302  Weight: 46.4 kg    Physical Exam:  General exam: awake, alert, no acute distress HEENT: atraumatic, clear conjunctiva, anicteric sclera,moist mucus membranes, hearing grossly normal  Respiratory system: CTAB, no wheezes, rales or rhonchi, normal respiratory effort. Cardiovascular system: normal S1/S2, RRR, no JVD, murmurs, rubs, gallops, no pedal edema.   Gastrointestinal system: soft, NT, ND, no HSM felt, +bowel sounds. Central nervous system: A&O x4. no gross focal neurologic deficits, normal speech Extremities: moves all, no edema, normal tone Skin: dry, intact, normal temperature, normal color, no rashes, lesions or ulcers Psychiatry: normal mood, congruent affect, judgement and insight appear normal  Labs   Data Reviewed: I have personally reviewed following labs and imaging  studies  CBC: Recent Labs  Lab 04/29/21 1710 04/29/21 1732  WBC 11.0*  --   NEUTROABS 8.4*  --   HGB 16.4 18.4*  HCT 49.1 54.0*  MCV 84.7  --   PLT 402*  --    Basic Metabolic Panel: Recent Labs  Lab 04/29/21 2316 04/30/21 0420 04/30/21 0607 04/30/21 1143 05/01/21 0837  NA 127* 132* 132* 131* 132*  K 3.8 3.7 3.5 3.4* 3.6  CL 101 104 105 102 99  CO2 15* 21* 22 23 26   GLUCOSE 248* 143* 132* 381* 412*  BUN 14 10 11 12 10   CREATININE 1.04 0.69 0.60* 0.62 0.59*  CALCIUM 8.8* 8.7* 8.8* 8.4* 8.7*   GFR: Estimated Creatinine Clearance: 98.3 mL/min (A) (by C-G formula based on SCr of 0.59 mg/dL (L)). Liver Function Tests: Recent Labs  Lab 04/29/21 1710  AST 13*  ALT 17  ALKPHOS 86  BILITOT 2.0*  PROT 7.1  ALBUMIN 4.3   Recent Labs  Lab 04/29/21 1710  LIPASE 28   No results for input(s): AMMONIA in the last 168 hours. Coagulation Profile: No results for input(s): INR, PROTIME in the last 168 hours. Cardiac Enzymes: No results for input(s): CKTOTAL, CKMB, CKMBINDEX, TROPONINI in the last 168 hours. BNP (last 3 results) No results for input(s): PROBNP in the last 8760 hours. HbA1C: No results for input(s): HGBA1C in the last 72 hours. CBG: Recent Labs  Lab 05/01/21 0401 05/01/21 0601 05/01/21 0737 05/01/21 1136 05/01/21 1634  GLUCAP 81 252* 225* 383* 184*   Lipid Profile: No results for input(s): CHOL, HDL, LDLCALC, TRIG, CHOLHDL, LDLDIRECT in the last 72 hours. Thyroid Function Tests: No results for input(s): TSH, T4TOTAL, FREET4, T3FREE, THYROIDAB in the last 72 hours. Anemia Panel: No results for input(s): VITAMINB12, FOLATE, FERRITIN, TIBC, IRON, RETICCTPCT in the last 72 hours. Sepsis Labs: No results for input(s): PROCALCITON, LATICACIDVEN in the last 168 hours.  Recent Results (from the past 240 hour(s))  Resp Panel by RT-PCR (Flu A&B, Covid) Nasopharyngeal Swab     Status: None   Collection Time: 04/29/21  6:45 PM   Specimen: Nasopharyngeal  Swab; Nasopharyngeal(NP) swabs in vial transport medium  Result Value Ref Range Status   SARS Coronavirus 2 by RT PCR NEGATIVE NEGATIVE Final    Comment: (NOTE) SARS-CoV-2 target nucleic acids are NOT DETECTED.  The SARS-CoV-2 RNA is generally detectable in upper respiratory specimens during the acute phase of infection. The lowest concentration of SARS-CoV-2 viral copies this assay can detect is 138 copies/mL. A negative result does not preclude SARS-Cov-2 infection and should not be used as the sole basis for treatment or other patient management decisions. A negative result may occur with  improper specimen collection/handling, submission of specimen other than nasopharyngeal swab,  presence of viral mutation(s) within the areas targeted by this assay, and inadequate number of viral copies(<138 copies/mL). A negative result must be combined with clinical observations, patient history, and epidemiological information. The expected result is Negative.  Fact Sheet for Patients:  BloggerCourse.com  Fact Sheet for Healthcare Providers:  SeriousBroker.it  This test is no t yet approved or cleared by the [place] FDA and  has been authorized for detection and/or diagnosis of SARS-CoV-2 by FDA under an Emergency Use Authorization (EUA). This EUA will remain  in effect (meaning this test can be used) for the duration of the COVID-19 declaration under Section 564(b)(1) of the Act, 21 U.S.C.section 360bbb-3(b)(1), unless the authorization is terminated  or revoked sooner.       Influenza A by PCR NEGATIVE NEGATIVE Final   Influenza B by PCR NEGATIVE NEGATIVE Final    Comment: (NOTE) The Xpert Xpress SARS-CoV-2/FLU/RSV plus assay is intended as an aid in the diagnosis of influenza from Nasopharyngeal swab specimens and should not be used as a sole basis for treatment. Nasal washings and aspirates are unacceptable for Xpert Xpress  SARS-CoV-2/FLU/RSV testing.  Fact Sheet for Patients: BloggerCourse.com  Fact Sheet for Healthcare Providers: SeriousBroker.it  This test is not yet approved or cleared by the [place] FDA and has been authorized for detection and/or diagnosis of SARS-CoV-2 by FDA under an Emergency Use Authorization (EUA). This EUA will remain in effect (meaning this test can be used) for the duration of the COVID-19 declaration under Section 564(b)(1) of the Act, 21 U.S.C. section 360bbb-3(b)(1), unless the authorization is terminated or revoked.  Performed at [facility], [address] [place], [place] [address]       Imaging Studies   No results found.   Medications   Scheduled Meds:  enoxaparin (LOVENOX) injection  40 mg Subcutaneous Q24H   insulin aspart  0-5 Units Subcutaneous QHS   insulin aspart  0-9 Units Subcutaneous TID WC   insulin aspart  6 Units Subcutaneous TID WC   insulin glargine-yfgn  40 Units Subcutaneous Daily   Continuous Infusions:     LOS: 1 day    Time spent: 30 minutes    Pennie Banter, DO [facility]  05/01/2021, 6:45 PM      If 7PM-7AM, please contact night-coverage. How to contact the [facility] Attending or Consulting provider 7A - 7P or covering provider during after hours 7P -7A, for this patient?    Check the care team in [facility] and look for a) attending/consulting TR[facility] provider listed and b) the [facility] team listed Log into [email or web address] and use 's universal password to access. If you do not have the password, please contact the hospital operator. Locate the [facility] provider you are looking for under [facility] and page to a number that you can be directly reached. If you still have difficulty reaching the provider, please page the [facility] (Director on Call) for the Hospitalists listed on amion for assistance.

## 2021-05-02 ENCOUNTER — Other Ambulatory Visit (HOSPITAL_COMMUNITY): Payer: Self-pay

## 2021-05-02 LAB — CBC
HCT: 35.3 % — ABNORMAL LOW (ref 39.0–52.0)
Hemoglobin: 11.3 g/dL — ABNORMAL LOW (ref 13.0–17.0)
MCH: 27.1 pg (ref 26.0–34.0)
MCHC: 32 g/dL (ref 30.0–36.0)
MCV: 84.7 fL (ref 80.0–100.0)
Platelets: 216 10*3/uL (ref 150–400)
RBC: 4.17 MIL/uL — ABNORMAL LOW (ref 4.22–5.81)
RDW: 13 % (ref 11.5–15.5)
WBC: 5.1 10*3/uL (ref 4.0–10.5)
nRBC: 0 % (ref 0.0–0.2)

## 2021-05-02 LAB — HEMOGLOBIN A1C
Hgb A1c MFr Bld: >15.5 % — ABNORMAL HIGH (ref 4.8–5.6)
Hgb A1c MFr Bld: >15.5 % — ABNORMAL HIGH (ref 4.8–5.6)
Mean Plasma Glucose: >398 mg/dL
Mean Plasma Glucose: >398 mg/dL

## 2021-05-02 LAB — BASIC METABOLIC PANEL
Anion gap: 6 (ref 5–15)
BUN: 10 mg/dL (ref 6–20)
CO2: 28 mmol/L (ref 22–32)
Calcium: 8.5 mg/dL — ABNORMAL LOW (ref 8.9–10.3)
Chloride: 100 mmol/L (ref 98–111)
Creatinine, Ser: 0.63 mg/dL (ref 0.61–1.24)
GFR, Estimated: >60 mL/min (ref >60)
Glucose, Bld: 340 mg/dL — ABNORMAL HIGH (ref 70–99)
Potassium: 3.7 mmol/L (ref 3.5–5.1)
Sodium: 134 mmol/L — ABNORMAL LOW (ref 135–145)

## 2021-05-02 LAB — GLUCOSE, CAPILLARY
Glucose-Capillary: 265 mg/dL — ABNORMAL HIGH (ref 70–99)
Glucose-Capillary: 239 mg/dL — ABNORMAL HIGH (ref 70–99)
Glucose-Capillary: 243 mg/dL — ABNORMAL HIGH (ref 70–99)
Glucose-Capillary: 247 mg/dL — ABNORMAL HIGH (ref 70–99)

## 2021-05-02 MED ORDER — INSULIN ASPART 100 UNIT/ML FLEXPEN
8.0000 [IU] | PEN_INJECTOR | Freq: Three times a day (TID) | SUBCUTANEOUS | 11 refills | Status: DC
  Filled 2021-05-02: qty 15, 30d supply, fill #0

## 2021-05-02 MED ORDER — INSULIN ASPART 100 UNIT/ML IJ SOLN
8.0000 [IU] | Freq: Three times a day (TID) | INTRAMUSCULAR | Status: DC
  Administered 2021-05-02 (×3): 8 [IU] via SUBCUTANEOUS

## 2021-05-02 MED ORDER — INSULIN GLARGINE 100 UNIT/ML SOLOSTAR PEN
45.0000 [IU] | PEN_INJECTOR | Freq: Every day | SUBCUTANEOUS | 11 refills | Status: DC
Start: 2021-05-02 — End: 2022-04-05
  Filled 2021-05-02: qty 15, 33d supply, fill #0

## 2021-05-02 MED ORDER — INSULIN PEN NEEDLE 32G X 4 MM MISC
0 refills | Status: AC
  Filled 2021-05-02: qty 100, 25d supply, fill #0

## 2021-05-02 NOTE — Progress Notes (Signed)
Pts Aunt ([name]- legal guardian) called stating someone came in asking pt questions regarding Lantus and she informed me she needed to be called when anyone comes in to talk to him so she can provide appropriate information. --Contacted the diabetes coordinator who is to see the pt today and relayed this information.   Brooke Pace, RN

## 2021-05-02 NOTE — Discharge Instructions (Signed)
For your mealtime insulin (Novolog) -  Take 8 units with meals PLUS extra # of units per sliding scale below:  Take 0-9 units 3 times daily with meals as follows: If CBG 70-120: 0 units If CBG 121-150: 1 unit If CBG 151-200: 2 units If CBG 201-250: 3 units If CBG 251-300: 5 units If CBG 301-350: 7 units If CBG 351-400: 9 units If CBG over 400: 12 units and call MD

## 2021-05-02 NOTE — Progress Notes (Signed)
Inpatient Diabetes Program Recommendations  AACE/ADA: New Consensus Statement on Inpatient Glycemic Control (2015)  Target Ranges:  Prepandial:   less than 140 mg/dL      Peak postprandial:   less than 180 mg/dL (1-2 hours)      Critically ill patients:  140 - 180 mg/dL   Lab Results  Component Value Date   GLUCAP 243 (H) 05/02/2021   HGBA1C >15.5 (H) 05/01/2021    Review of Glycemic Control  Diabetes history: DM1 Outpatient Diabetes medications: Lantus up to 50 units/day, Novolog s/s TID Current orders for Inpatient glycemic control: Semglee 40 units QD, Novolog 0-9 units TID with meals + 8 units TID  Inpatient Diabetes Program Recommendations:   Spoke with pt about A1C results with them >15.5 () and explained what an A1C is, basic pathophysiology of DM Type 1, basic home care, basic diabetes diet nutrition principles, importance of checking CBGs and maintaining good CBG control to prevent long-term and short-term complications. Reviewed signs and symptoms of hyperglycemia and hypoglycemia and how to treat hypoglycemia at home. Also reviewed blood sugar goals at home.   Spoke with legal guardian Lorin Picket regarding patient. 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 Patient lives with his grand father and states his grandfather reminds him @ 9 pm to take his insulin but patient falls asleep before he remembers to take insulin. Discussed options with patient and patient planned to take Lantus insulin when his grandfather leaves for work @ 9 pm. Reviewed this plan with aunt. Aunt states patient is up most of the night and is not consistent in taking his insulin.  Patient states has appointment @ Dr. Juluis Mire office soon but not certain of date. Discussed with patient need  to discuss issues with Dr. Fransico Michael and follow through on plan to consistently take his insulin. Aunt is uncertain of appointment with Dr. Fransico Michael and plans to followup on date.  Thank you, Billy Fischer. Olivene Cookston, RN, MSN, CDE  Diabetes Coordinator Inpatient Glycemic Control Team Team Pager [phone] (8am-5pm) 05/02/2021 1:58 PM

## 2021-05-02 NOTE — Progress Notes (Signed)
Pt to be discharged from 4E to home with grandfather. D/c information and medication education provided. Telemetry and IV removed.   Brooke Pace, RN

## 2021-05-02 NOTE — Discharge Summary (Signed)
Physician Discharge Summary  Keith [name] FOY:[id number] DOB: 2001/01/12 DOA: 04/29/2021  PCP: Aleen Campi, DO  Admit date: 04/29/2021 Discharge date: 05/02/2021  Admitted From: home Disposition:  home  Recommendations for Outpatient Follow-up:  Follow up with PCP in 1-2 weeks Please obtain BMP/CBC in one week Please follow up on glycemic control and insulin requirements. Recommend consideration of continuous glucose monitor and insulin pump   Home Health: no  Equipment/Devices: none   Discharge Condition: stable  CODE STATUS: full  Diet recommendation: Carb Modified      Discharge Diagnoses: Principal Problem:   DKA (diabetic ketoacidosis) ([place]) Active Problems:   AKI (acute kidney injury) ([place])   Hyponatremia   Leukocytosis   Thrombocytosis    Summary of HPI and Hospital Course:   20 y.o male with type 1 diabetes, anxiety, depression and PTSD, admitted on evening of 04/29/21 with DKA.  Treated with insulin drip per protocol.  DKA resolved, but patient had persistently elevated glucose running in 200-300's.  Gap remained closed.  Subcutaneous insulin titrated for better control and patient was seen by diabetes educator / coordinator.     Discharged on Lantus 45 units daily, Novolog 8 units TID with meals plus 0-9 units per sliding scale.    Diabetes educator met with patient and spoke with his aunt and guardian at length about importance of controlling his glucose to prevent severe long-term, life-altering complications.    Patient is asymptomatic and clinically improved.  Stable for discharge home with close outpatient follow-up for ongoing management.   Discharge Instructions   Discharge Instructions     Call MD for:   Complete by: As directed    Episodes of hypoglycemia or if glucose running consistently high (especially if staying above 300)   Call MD for:  extreme fatigue   Complete by: As directed    Call MD for:  persistant dizziness or  light-headedness   Complete by: As directed    Call MD for:  persistant nausea and vomiting   Complete by: As directed    Diet - low sodium heart healthy   Complete by: As directed    Discharge instructions   Complete by: As directed    Recommend you keep a log of all your blood sugar readings and how many units of insulin you are using. Write all this information down and bring with you to your follow up doctor's appointment to help them decide if any adjustments are needed.    It's extremely important to keep your blood sugar controlled to prevent serious long-term complications including kidney failure (and need for dialysis), amputations of limbs due to the effects high sugars have on your blood circulation, blindness, and many others.    Please stay in close contact with your outpatient doctors and ask for their help if you have issues or questions/concerns.  --Dr. Arbutus Ped, DO [facility]   Increase activity slowly   Complete by: As directed       Allergies as of 05/02/2021       Reactions   Amoxicillin Hives, Rash   Penicillins Hives, Rash   Has patient had a PCN reaction causing immediate rash, facial/tongue/throat swelling, SOB or lightheadedness with hypotension: Yes Has patient had a PCN reaction causing severe rash involving mucus membranes or skin necrosis: Yes Has patient had a PCN reaction that required hospitalization: No Has patient had a PCN reaction occurring within the last 10 years: Yes If all of the above answers are "NO", then may  proceed with Cephalosporin use.        Medication List     STOP taking these medications    ketoconazole 2 % cream Commonly known as: NIZORAL       TAKE these medications    Accu-Chek FastClix Lancets Misc TEST SIX TIMES DAILY   Accu-Chek Guide test strip Generic drug: glucose blood Test blood sugar 8 times daily   Accu-Chek Guide w/Device Kit 1 Units by Does not apply route daily. What changed: Another  medication with the same name was removed. Continue taking this medication, and follow the directions you see here.   Baqsimi Two Pack 3 MG/DOSE Powd Generic drug: Glucagon Place 1 application into the nose as needed. Use as directed if unconscious, unable to take food po, or having a seizure due to hypoglycemia   BD Pen Needle Nano 2nd Gen 32G X 4 MM Misc Generic drug: Insulin Pen Needle USE 1 NEEDLE SIX TIMES DAILY   Glucagon Emergency 1 MG Kit INJECT 1 MG IN THE MUSCLE OF THIGH IF UNRESPONSIVE, UNABLE TO SWALLOW, UNCONSCIOUS AND/OR HAS SEIZURE   insulin aspart 100 UNIT/ML FlexPen Commonly known as: NOVOLOG Inject 8 Units into the skin 3 (three) times daily with meals. Add on additional # units per sliding scale instructions. What changed:  how much to take how to take this when to take this additional instructions Another medication with the same name was removed. Continue taking this medication, and follow the directions you see here.   insulin glargine 100 UNIT/ML Solostar Pen Commonly known as: LANTUS Inject 45 Units into the skin daily. What changed:  how much to take how to take this when to take this additional instructions   Urine Glucose-Ketones Test Strp 1 each by Other route as needed.        Allergies  Allergen Reactions   Amoxicillin Hives and Rash   Penicillins Hives and Rash    Has patient had a PCN reaction causing immediate rash, facial/tongue/throat swelling, SOB or lightheadedness with hypotension: Yes Has patient had a PCN reaction causing severe rash involving mucus membranes or skin necrosis: Yes Has patient had a PCN reaction that required hospitalization: No Has patient had a PCN reaction occurring within the last 10 years: Yes If all of the above answers are "NO", then may proceed with Cephalosporin use.      If you experience worsening of your admission symptoms, develop shortness of breath, life threatening emergency, suicidal or  homicidal thoughts you must seek medical attention immediately by calling 911 or calling your MD immediately  if symptoms less severe.    Please note   You were cared for by a hospitalist during your hospital stay. If you have any questions about your discharge medications or the care you received while you were in the hospital after you are discharged, you can call the unit and asked to speak with the hospitalist on call if the hospitalist that took care of you is not available. Once you are discharged, your primary care physician will handle any further medical issues. Please note that NO REFILLS for any discharge medications will be authorized once you are discharged, as it is imperative that you return to your primary care physician (or establish a relationship with a primary care physician if you do not have one) for your aftercare needs so that they can reassess your need for medications and monitor your lab values.   Consultations: Diabetes coordinator    Procedures/Studies: No results found.  Subjective: Pt reports feeling well.  No acute complaints.  Discussed importance of watching and controlling sugars, writing down sugars and # units insulin using to bring to follow up appointments.  He expresses agreement and understanding.    Discharge Exam: Vitals:   05/02/21 0808 05/02/21 1237  BP: 105/69 107/62  Pulse: 71 77  Resp:  17  Temp: 98.2 F (36.8 C) 98.3 F (36.8 C)  SpO2: 100% 100%   Vitals:   05/01/21 2329 05/02/21 0312 05/02/21 0808 05/02/21 1237  BP: 110/61 (!) 108/56 105/69 107/62  Pulse: 75 70 71 77  Resp: '16 16  17  ' Temp: 99 F (37.2 C) 98.6 F (37 C) 98.2 F (36.8 C) 98.3 F (36.8 C)  TempSrc: Oral Oral Oral Oral  SpO2: 98% 98% 100% 100%  Weight:      Height:        General: Pt is alert, awake, not in acute distress Cardiovascular: RRR, S1/S2 +, no rubs, no gallops Respiratory: CTA bilaterally, no wheezing, no rhonchi Abdominal: Soft, NT, ND,  bowel sounds + Extremities: no edema, no cyanosis    The results of significant diagnostics from this hospitalization (including imaging, microbiology, ancillary and laboratory) are listed below for reference.     Microbiology: Recent Results (from the past 240 hour(s))  Resp Panel by RT-PCR (Flu A&B, Covid) Nasopharyngeal Swab     Status: None   Collection Time: 04/29/21  6:45 PM   Specimen: Nasopharyngeal Swab; Nasopharyngeal(NP) swabs in vial transport medium  Result Value Ref Range Status   SARS Coronavirus 2 by RT PCR NEGATIVE NEGATIVE Final    Comment: (NOTE) SARS-CoV-2 target nucleic acids are NOT DETECTED.  The SARS-CoV-2 RNA is generally detectable in upper respiratory specimens during the acute phase of infection. The lowest concentration of SARS-CoV-2 viral copies this assay can detect is 138 copies/mL. A negative result does not preclude SARS-Cov-2 infection and should not be used as the sole basis for treatment or other patient management decisions. A negative result may occur with  improper specimen collection/handling, submission of specimen other than nasopharyngeal swab, presence of viral mutation(s) within the areas targeted by this assay, and inadequate number of viral copies(<138 copies/mL). A negative result must be combined with clinical observations, patient history, and epidemiological information. The expected result is Negative.  Fact Sheet for Patients:  EntrepreneurPulse.com.au  Fact Sheet for Healthcare Providers:  IncredibleEmployment.be  This test is no t yet approved or cleared by the [place] FDA and  has been authorized for detection and/or diagnosis of SARS-CoV-2 by FDA under an Emergency Use Authorization (EUA). This EUA will remain  in effect (meaning this test can be used) for the duration of the COVID-19 declaration under Section 564(b)(1) of the Act, 21 U.S.C.section 360bbb-3(b)(1), unless the  authorization is terminated  or revoked sooner.       Influenza A by PCR NEGATIVE NEGATIVE Final   Influenza B by PCR NEGATIVE NEGATIVE Final    Comment: (NOTE) The Xpert Xpress SARS-CoV-2/FLU/RSV plus assay is intended as an aid in the diagnosis of influenza from Nasopharyngeal swab specimens and should not be used as a sole basis for treatment. Nasal washings and aspirates are unacceptable for Xpert Xpress SARS-CoV-2/FLU/RSV testing.  Fact Sheet for Patients: EntrepreneurPulse.com.au  Fact Sheet for Healthcare Providers: IncredibleEmployment.be  This test is not yet approved or cleared by the [place] FDA and has been authorized for detection and/or diagnosis of SARS-CoV-2 by FDA under an Emergency Use Authorization (EUA). This EUA  will remain in effect (meaning this test can be used) for the duration of the COVID-19 declaration under Section 564(b)(1) of the Act, 21 U.S.C. section 360bbb-3(b)(1), unless the authorization is terminated or revoked.  Performed at [facility], [place] [address], [place], [place] [address]      Labs: BNP (last 3 results) No results for input(s): BNP in the last 8760 hours. Basic Metabolic Panel: Recent Labs  Lab 04/30/21 0420 04/30/21 0607 04/30/21 1143 05/01/21 0837 05/02/21 0104  NA 132* 132* 131* 132* 134*  K 3.7 3.5 3.4* 3.6 3.7  CL 104 105 102 99 100  CO2 21* '22 23 26 28  ' GLUCOSE 143* 132* 381* 412* 340*  BUN '10 11 12 10 10  ' CREATININE 0.69 0.60* 0.62 0.59* 0.63  CALCIUM 8.7* 8.8* 8.4* 8.7* 8.5*   Liver Function Tests: Recent Labs  Lab 04/29/21 1710  AST 13*  ALT 17  ALKPHOS 86  BILITOT 2.0*  PROT 7.1  ALBUMIN 4.3   Recent Labs  Lab 04/29/21 1710  LIPASE 28   No results for input(s): AMMONIA in the last 168 hours. CBC: Recent Labs  Lab 04/29/21 1710 04/29/21 1732 05/02/21 0104  WBC 11.0*  --  5.1  NEUTROABS 8.4*  --   --   HGB 16.4 18.4* 11.3*  HCT 49.1 54.0*  35.3*  MCV 84.7  --  84.7  PLT 402*  --  216   Cardiac Enzymes: No results for input(s): CKTOTAL, CKMB, CKMBINDEX, TROPONINI in the last 168 hours. BNP: Invalid input(s): POCBNP CBG: Recent Labs  Lab 05/01/21 1634 05/01/21 2002 05/02/21 0608 05/02/21 0723 05/02/21 1120  GLUCAP 184* 215* 265* 239* 243*   D-Dimer No results for input(s): DDIMER in the last 72 hours. Hgb A1c Recent Labs    04/29/21 2316 05/01/21 0034  HGBA1C >15.5* >15.5*   Lipid Profile No results for input(s): CHOL, HDL, LDLCALC, TRIG, CHOLHDL, LDLDIRECT in the last 72 hours. Thyroid function studies No results for input(s): TSH, T4TOTAL, T3FREE, THYROIDAB in the last 72 hours.  Invalid input(s): FREET3 Anemia work up No results for input(s): VITAMINB12, FOLATE, FERRITIN, TIBC, IRON, RETICCTPCT in the last 72 hours. Urinalysis    Component Value Date/Time   COLORURINE COLORLESS (A) 04/29/2021 2007   APPEARANCEUR CLEAR 04/29/2021 2007   LABSPEC 1.015 04/29/2021 2007   PHURINE 5.0 04/29/2021 2007   GLUCOSEU >=500 (A) 04/29/2021 2007   HGBUR NEGATIVE 04/29/2021 2007   BILIRUBINUR NEGATIVE 04/29/2021 2007   KETONESUR 80 (A) 04/29/2021 2007   PROTEINUR NEGATIVE 04/29/2021 2007   UROBILINOGEN 1.0 08/07/2013 1528   NITRITE NEGATIVE 04/29/2021 2007   LEUKOCYTESUR NEGATIVE 04/29/2021 2007   Sepsis Labs Invalid input(s): PROCALCITONIN,  WBC,  LACTICIDVEN Microbiology Recent Results (from the past 240 hour(s))  Resp Panel by RT-PCR (Flu A&B, Covid) Nasopharyngeal Swab     Status: None   Collection Time: 04/29/21  6:45 PM   Specimen: Nasopharyngeal Swab; Nasopharyngeal(NP) swabs in vial transport medium  Result Value Ref Range Status   SARS Coronavirus 2 by RT PCR NEGATIVE NEGATIVE Final    Comment: (NOTE) SARS-CoV-2 target nucleic acids are NOT DETECTED.  The SARS-CoV-2 RNA is generally detectable in upper respiratory specimens during the acute phase of infection. The lowest concentration of  SARS-CoV-2 viral copies this assay can detect is 138 copies/mL. A negative result does not preclude SARS-Cov-2 infection and should not be used as the sole basis for treatment or other patient management decisions. A negative result may occur with  improper specimen collection/handling, submission of specimen other than nasopharyngeal swab, presence of viral mutation(s) within the areas targeted by this assay, and inadequate number of viral copies(<138 copies/mL). A negative result must be combined with clinical observations, patient history, and epidemiological information. The expected result is Negative.  Fact Sheet for Patients:  EntrepreneurPulse.com.au  Fact Sheet for Healthcare Providers:  IncredibleEmployment.be  This test is no t yet approved or cleared by the [place] FDA and  has been authorized for detection and/or diagnosis of SARS-CoV-2 by FDA under an Emergency Use Authorization (EUA). This EUA will remain  in effect (meaning this test can be used) for the duration of the COVID-19 declaration under Section 564(b)(1) of the Act, 21 U.S.C.section 360bbb-3(b)(1), unless the authorization is terminated  or revoked sooner.       Influenza A by PCR NEGATIVE NEGATIVE Final   Influenza B by PCR NEGATIVE NEGATIVE Final    Comment: (NOTE) The Xpert Xpress SARS-CoV-2/FLU/RSV plus assay is intended as an aid in the diagnosis of influenza from Nasopharyngeal swab specimens and should not be used as a sole basis for treatment. Nasal washings and aspirates are unacceptable for Xpert Xpress SARS-CoV-2/FLU/RSV testing.  Fact Sheet for Patients: EntrepreneurPulse.com.au  Fact Sheet for Healthcare Providers: IncredibleEmployment.be  This test is not yet approved or cleared by the [place] FDA and has been authorized for detection and/or diagnosis of SARS-CoV-2 by FDA under an Emergency Use  Authorization (EUA). This EUA will remain in effect (meaning this test can be used) for the duration of the COVID-19 declaration under Section 564(b)(1) of the Act, 21 U.S.C. section 360bbb-3(b)(1), unless the authorization is terminated or revoked.  Performed at [facility], [place] [address] [place], [place] [address]      Time coordinating discharge: Over 30 minutes  SIGNED:   Ezekiel Slocumb, DO [facility] 05/02/2021, 2:57 PM   If 7PM-7AM, please contact night-coverage [email or web address]

## 2021-05-07 ENCOUNTER — Other Ambulatory Visit (INDEPENDENT_AMBULATORY_CARE_PROVIDER_SITE_OTHER): Payer: Self-pay | Admitting: "Endocrinology

## 2021-05-07 DIAGNOSIS — IMO0002 Reserved for concepts with insufficient information to code with codable children: Secondary | ICD-10-CM

## 2021-05-07 DIAGNOSIS — E1065 Type 1 diabetes mellitus with hyperglycemia: Secondary | ICD-10-CM

## 2021-05-09 ENCOUNTER — Telehealth (INDEPENDENT_AMBULATORY_CARE_PROVIDER_SITE_OTHER): Payer: Self-pay | Admitting: "Endocrinology

## 2021-05-09 NOTE — Telephone Encounter (Signed)
  Who's calling (name and relationship to patient) : [name] - aunt/legal guardian  Best contact number: [phone]  Provider they see: Dr. Fransico Michael  Reason for call: Patient was hospitalized over the weekend and aunt would like to discuss the plan of action they were given in the hospital with Dr. Fransico Michael.    PRESCRIPTION REFILL ONLY  Name of prescription:  Pharmacy:

## 2021-05-10 ENCOUNTER — Telehealth (INDEPENDENT_AMBULATORY_CARE_PROVIDER_SITE_OTHER): Payer: Self-pay | Admitting: "Endocrinology

## 2021-05-10 NOTE — Telephone Encounter (Signed)
Ms [name] called for advice. 2. Keith [name]  is staying with his grandfather now. Ms [name] says he is taking 40 units of Lantus now at night. He is supposed to be taking 8 units of novolog plus a sliding scale at meals.Unfortunately, I don't know what the sliding scale is.He says he wants to come see me, but then he doesn't show.   3. I told her that he I am willing to see him again if he is willing to keep the appointment.  Molli Knock, MD, CDE

## 2021-05-10 NOTE — Telephone Encounter (Signed)
Message sent in secure chat. Will paste response.

## 2021-08-23 ENCOUNTER — Ambulatory Visit (INDEPENDENT_AMBULATORY_CARE_PROVIDER_SITE_OTHER): Payer: Medicaid Other | Admitting: "Endocrinology

## 2021-09-05 ENCOUNTER — Other Ambulatory Visit (INDEPENDENT_AMBULATORY_CARE_PROVIDER_SITE_OTHER): Payer: Self-pay | Admitting: "Endocrinology

## 2021-10-03 NOTE — Progress Notes (Signed)
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 normal.    Assessment and Plan:  Assessment  ASSESSMENT:  1. Uncontrolled T1DM:   A. Kasim's BGs and ketones are much too high due to his own noncompliance. When he says that he is taking his insulins, he is not always being truthful. There is no way that he could have these adverse numbers and be taking his insulins.   B. He says the wants to have anew pump and Dexcom.   C. If he continues on this path he will be dead within the next few years.  2. Hypoglycemia: He reports having some low BG symptoms. In the past he has sometimes developed hypoglycemia after taking large doses of insulin haphazardly without checking his BGs.  3. Dehydration: He is reasonably hydrated today,  4-5. Goiter/thyroiditis: His thyroid gland is more enlarged today and his thyroiditis is clinically quiescent. He was euthyroid in February 2021. It was likely that he would follow in the footsteps of his maternal grandmother and become hypothyroid in the future. We need to repeat his TFTs. 6-7. Growth delay, physical/unintentional weight loss: His height growth has plateaued. His weight is lower. 8. Tinea pedis: His tinea is still active. He needs to use the ketoconazole every day.   9-12. Adjustment reaction/hallucinations/deliberate self-cutting/PTSD:  These problems had been better after starting psych meds and undergoing counseling. Unfortunately, he stopped taking those medications. He says that when he sees his new psychiatrist, he will take whatever medications are prescribed. 13. Peripheral neuropathy: He has no peripheral neuropathy  today. 14. Autonomic neuropathy: He still has inappropriate sinus tachycardia.   PLAN:  1. Diagnostic: TFTs, CMP, microalbumin/creatinine ratio. Send in BG report in 2 weeks 2. Therapeutic: Take 45 units of Lantus and follow his Novolog 120/30/12 plan.  3. Patient education: We discussed all  of the above, to include the need to take better control of his BG. .  4. Follow-up: 1 month  Level of Service: This visit lasted in excess of 55 minutes. More than 50% of the visit was devoted to counseling.   Tillman Sers, MD, CDE Pediatric and Adult Endocrinology

## 2021-10-04 ENCOUNTER — Other Ambulatory Visit: Payer: Self-pay

## 2021-10-04 ENCOUNTER — Ambulatory Visit (INDEPENDENT_AMBULATORY_CARE_PROVIDER_SITE_OTHER): Payer: Medicaid Other | Admitting: "Endocrinology

## 2021-10-04 VITALS — BP 112/64 | HR 103 | Wt 109.2 lb

## 2021-10-04 DIAGNOSIS — E10649 Type 1 diabetes mellitus with hypoglycemia without coma: Secondary | ICD-10-CM | POA: Diagnosis not present

## 2021-10-04 DIAGNOSIS — Z91199 Patient's noncompliance with other medical treatment and regimen due to unspecified reason: Secondary | ICD-10-CM

## 2021-10-04 DIAGNOSIS — E063 Autoimmune thyroiditis: Secondary | ICD-10-CM

## 2021-10-04 DIAGNOSIS — E1043 Type 1 diabetes mellitus with diabetic autonomic (poly)neuropathy: Secondary | ICD-10-CM

## 2021-10-04 DIAGNOSIS — E1065 Type 1 diabetes mellitus with hyperglycemia: Secondary | ICD-10-CM | POA: Diagnosis not present

## 2021-10-04 DIAGNOSIS — R000 Tachycardia, unspecified: Secondary | ICD-10-CM

## 2021-10-04 DIAGNOSIS — B353 Tinea pedis: Secondary | ICD-10-CM

## 2021-10-04 DIAGNOSIS — E049 Nontoxic goiter, unspecified: Secondary | ICD-10-CM

## 2021-10-04 LAB — POCT GLYCOSYLATED HEMOGLOBIN (HGB A1C): HbA1c POC (<> result, manual entry): >14 % (ref 4.0–5.6)

## 2021-10-04 LAB — T4, FREE: Free T4: 1.2 ng/dL (ref 0.8–1.4)

## 2021-10-04 LAB — LIPID PANEL
Cholesterol: 225 mg/dL — ABNORMAL HIGH (ref <170)
HDL: 76 mg/dL (ref >45)
LDL Cholesterol (Calc): 133 mg/dL (calc) — ABNORMAL HIGH (ref <110)
Non-HDL Cholesterol (Calc): 149 mg/dL (calc) — ABNORMAL HIGH (ref <120)
Total CHOL/HDL Ratio: 3 (calc) (ref <5.0)
Triglycerides: 70 mg/dL (ref <90)

## 2021-10-04 LAB — POCT GLUCOSE (DEVICE FOR HOME USE): Glucose Fasting, POC: 296 mg/dL — AB (ref 70–99)

## 2021-10-04 LAB — T3, FREE: T3, Free: 2.4 pg/mL — ABNORMAL LOW (ref 3.0–4.7)

## 2021-10-04 LAB — COMPREHENSIVE METABOLIC PANEL
AG Ratio: 2 (calc) (ref 1.0–2.5)
ALT: 9 U/L (ref 8–46)
AST: 11 U/L — ABNORMAL LOW (ref 12–32)
Albumin: 4.9 g/dL (ref 3.6–5.1)
Alkaline phosphatase (APISO): 91 U/L (ref 46–169)
BUN: 8 mg/dL (ref 7–20)
CO2: 13 mmol/L — ABNORMAL LOW (ref 20–32)
Calcium: 9.6 mg/dL (ref 8.9–10.4)
Chloride: 101 mmol/L (ref 98–110)
Creat: 0.76 mg/dL (ref 0.60–1.24)
Globulin: 2.4 g/dL (calc) (ref 2.1–3.5)
Glucose, Bld: 286 mg/dL — ABNORMAL HIGH (ref 65–99)
Potassium: 4.2 mmol/L (ref 3.8–5.1)
Sodium: 134 mmol/L — ABNORMAL LOW (ref 135–146)
Total Bilirubin: 0.7 mg/dL (ref 0.2–1.1)
Total Protein: 7.3 g/dL (ref 6.3–8.2)

## 2021-10-04 LAB — TSH: TSH: 0.41 mIU/L — ABNORMAL LOW (ref 0.50–4.30)

## 2021-10-04 NOTE — Patient Instructions (Addendum)
Follow up visit in 1 month. Call in BG report in 2 weeks.   At [facility], we are committed to providing exceptional care. You will receive a patient satisfaction survey through text or email regarding your visit today. Your opinion is important to me. Comments are appreciated.

## 2021-10-21 ENCOUNTER — Other Ambulatory Visit (INDEPENDENT_AMBULATORY_CARE_PROVIDER_SITE_OTHER): Payer: Self-pay | Admitting: "Endocrinology

## 2021-10-31 ENCOUNTER — Ambulatory Visit (INDEPENDENT_AMBULATORY_CARE_PROVIDER_SITE_OTHER): Payer: Medicaid Other | Admitting: "Endocrinology

## 2021-11-06 ENCOUNTER — Other Ambulatory Visit: Payer: Self-pay

## 2021-11-06 ENCOUNTER — Encounter (HOSPITAL_COMMUNITY): Payer: Self-pay | Admitting: Emergency Medicine

## 2021-11-06 ENCOUNTER — Inpatient Hospital Stay (HOSPITAL_COMMUNITY)
Admission: EM | Admit: 2021-11-06 | Discharge: 2021-11-08 | DRG: 639 | Disposition: A | Discharge status: Home / Self Care | Payer: Medicaid Other | Attending: Internal Medicine | Admitting: Internal Medicine

## 2021-11-06 ENCOUNTER — Emergency Department (HOSPITAL_COMMUNITY): Payer: Medicaid Other

## 2021-11-06 DIAGNOSIS — Z833 Family history of diabetes mellitus: Secondary | ICD-10-CM

## 2021-11-06 DIAGNOSIS — Z68.41 Body mass index (BMI) pediatric, less than 5th percentile for age: Secondary | ICD-10-CM

## 2021-11-06 DIAGNOSIS — E1110 Type 2 diabetes mellitus with ketoacidosis without coma: Secondary | ICD-10-CM | POA: Diagnosis present

## 2021-11-06 DIAGNOSIS — F319 Bipolar disorder, unspecified: Secondary | ICD-10-CM | POA: Diagnosis present

## 2021-11-06 DIAGNOSIS — Z794 Long term (current) use of insulin: Secondary | ICD-10-CM

## 2021-11-06 DIAGNOSIS — F419 Anxiety disorder, unspecified: Secondary | ICD-10-CM | POA: Diagnosis present

## 2021-11-06 DIAGNOSIS — Z91199 Patient's noncompliance with other medical treatment and regimen due to unspecified reason: Secondary | ICD-10-CM

## 2021-11-06 DIAGNOSIS — E876 Hypokalemia: Secondary | ICD-10-CM | POA: Diagnosis present

## 2021-11-06 DIAGNOSIS — F1290 Cannabis use, unspecified, uncomplicated: Secondary | ICD-10-CM | POA: Diagnosis present

## 2021-11-06 DIAGNOSIS — E1010 Type 1 diabetes mellitus with ketoacidosis without coma: Principal | ICD-10-CM | POA: Diagnosis present

## 2021-11-06 DIAGNOSIS — Z20822 Contact with and (suspected) exposure to covid-19: Secondary | ICD-10-CM | POA: Diagnosis present

## 2021-11-06 DIAGNOSIS — F4310 Post-traumatic stress disorder, unspecified: Secondary | ICD-10-CM | POA: Diagnosis present

## 2021-11-06 DIAGNOSIS — J45909 Unspecified asthma, uncomplicated: Secondary | ICD-10-CM | POA: Diagnosis present

## 2021-11-06 DIAGNOSIS — Z87891 Personal history of nicotine dependence: Secondary | ICD-10-CM

## 2021-11-06 DIAGNOSIS — Z809 Family history of malignant neoplasm, unspecified: Secondary | ICD-10-CM

## 2021-11-06 HISTORY — DX: Cannabis abuse with other cannabis-induced disorder: F12.188

## 2021-11-06 LAB — BASIC METABOLIC PANEL
Anion gap: 4 — ABNORMAL LOW (ref 5–15)
Anion gap: 8 (ref 5–15)
Anion gap: 9 (ref 5–15)
BUN: <5 mg/dL — ABNORMAL LOW (ref 6–20)
BUN: <5 mg/dL — ABNORMAL LOW (ref 6–20)
BUN: <5 mg/dL — ABNORMAL LOW (ref 6–20)
CO2: 16 mmol/L — ABNORMAL LOW (ref 22–32)
CO2: 8 mmol/L — ABNORMAL LOW (ref 22–32)
CO2: 16 mmol/L — ABNORMAL LOW (ref 22–32)
Calcium: 4.4 mg/dL — CL (ref 8.9–10.3)
Calcium: 8.8 mg/dL — ABNORMAL LOW (ref 8.9–10.3)
Calcium: 8.5 mg/dL — ABNORMAL LOW (ref 8.9–10.3)
Chloride: 108 mmol/L (ref 98–111)
Chloride: 120 mmol/L — ABNORMAL HIGH (ref 98–111)
Chloride: 109 mmol/L (ref 98–111)
Creatinine, Ser: 0.51 mg/dL — ABNORMAL LOW (ref 0.61–1.24)
Creatinine, Ser: 0.81 mg/dL (ref 0.61–1.24)
Creatinine, Ser: 0.63 mg/dL (ref 0.61–1.24)
GFR, Estimated: >60 mL/min (ref >60)
GFR, Estimated: >60 mL/min (ref >60)
GFR, Estimated: >60 mL/min (ref >60)
Glucose, Bld: 222 mg/dL — ABNORMAL HIGH (ref 70–99)
Glucose, Bld: 285 mg/dL — ABNORMAL HIGH (ref 70–99)
Glucose, Bld: 115 mg/dL — ABNORMAL HIGH (ref 70–99)
Potassium: 3.5 mmol/L (ref 3.5–5.1)
Potassium: 4.7 mmol/L (ref 3.5–5.1)
Potassium: 3.5 mmol/L (ref 3.5–5.1)
Sodium: 132 mmol/L — ABNORMAL LOW (ref 135–145)
Sodium: 132 mmol/L — ABNORMAL LOW (ref 135–145)
Sodium: 134 mmol/L — ABNORMAL LOW (ref 135–145)

## 2021-11-06 LAB — COMPREHENSIVE METABOLIC PANEL
ALT: 9 U/L (ref 0–44)
AST: 8 U/L — ABNORMAL LOW (ref 15–41)
Albumin: 3.5 g/dL (ref 3.5–5.0)
Alkaline Phosphatase: 69 U/L (ref 38–126)
Anion gap: 14 (ref 5–15)
BUN: 5 mg/dL — ABNORMAL LOW (ref 6–20)
CO2: 8 mmol/L — ABNORMAL LOW (ref 22–32)
Calcium: 7.2 mg/dL — ABNORMAL LOW (ref 8.9–10.3)
Chloride: 110 mmol/L (ref 98–111)
Creatinine, Ser: 0.93 mg/dL (ref 0.61–1.24)
GFR, Estimated: >60 mL/min (ref >60)
Glucose, Bld: 240 mg/dL — ABNORMAL HIGH (ref 70–99)
Potassium: 2.9 mmol/L — ABNORMAL LOW (ref 3.5–5.1)
Sodium: 132 mmol/L — ABNORMAL LOW (ref 135–145)
Total Bilirubin: 0.7 mg/dL (ref 0.3–1.2)
Total Protein: 5.6 g/dL — ABNORMAL LOW (ref 6.5–8.1)

## 2021-11-06 LAB — CBC
HCT: 56.7 % — ABNORMAL HIGH (ref 39.0–52.0)
Hemoglobin: 17.4 g/dL — ABNORMAL HIGH (ref 13.0–17.0)
MCH: 26.3 pg (ref 26.0–34.0)
MCHC: 30.7 g/dL (ref 30.0–36.0)
MCV: 85.8 fL (ref 80.0–100.0)
Platelets: 293 10*3/uL (ref 150–400)
RBC: 6.61 MIL/uL — ABNORMAL HIGH (ref 4.22–5.81)
RDW: 13.3 % (ref 11.5–15.5)
WBC: 10.9 10*3/uL — ABNORMAL HIGH (ref 4.0–10.5)
nRBC: 0 % (ref 0.0–0.2)

## 2021-11-06 LAB — URINALYSIS, ROUTINE W REFLEX MICROSCOPIC
Bilirubin Urine: NEGATIVE
Glucose, UA: >=500 mg/dL — AB
Ketones, ur: >80 mg/dL — AB
Leukocytes,Ua: NEGATIVE
Nitrite: NEGATIVE
Protein, ur: 100 mg/dL — AB
Specific Gravity, Urine: 1.02 (ref 1.005–1.030)
pH: 6 (ref 5.0–8.0)

## 2021-11-06 LAB — I-STAT ARTERIAL BLOOD GAS, ED
Acid-base deficit: 13 mmol/L — ABNORMAL HIGH (ref 0.0–2.0)
Bicarbonate: 12.8 mmol/L — ABNORMAL LOW (ref 20.0–28.0)
Calcium, Ion: 1.37 mmol/L (ref 1.15–1.40)
HCT: 49 % (ref 39.0–52.0)
Hemoglobin: 16.7 g/dL (ref 13.0–17.0)
O2 Saturation: 96 %
Patient temperature: 98.6
Potassium: 3.9 mmol/L (ref 3.5–5.1)
Sodium: 134 mmol/L — ABNORMAL LOW (ref 135–145)
TCO2: 14 mmol/L — ABNORMAL LOW (ref 22–32)
pCO2 arterial: 31.1 mmHg — ABNORMAL LOW (ref 32–48)
pH, Arterial: 7.222 — ABNORMAL LOW (ref 7.35–7.45)
pO2, Arterial: 99 mmHg (ref 83–108)

## 2021-11-06 LAB — CBG MONITORING, ED
Glucose-Capillary: 358 mg/dL — ABNORMAL HIGH (ref 70–99)
Glucose-Capillary: 249 mg/dL — ABNORMAL HIGH (ref 70–99)
Glucose-Capillary: 281 mg/dL — ABNORMAL HIGH (ref 70–99)
Glucose-Capillary: 307 mg/dL — ABNORMAL HIGH (ref 70–99)
Glucose-Capillary: 250 mg/dL — ABNORMAL HIGH (ref 70–99)

## 2021-11-06 LAB — URINALYSIS, MICROSCOPIC (REFLEX)

## 2021-11-06 LAB — I-STAT VENOUS BLOOD GAS, ED
Acid-base deficit: 20 mmol/L — ABNORMAL HIGH (ref 0.0–2.0)
Bicarbonate: 8.6 mmol/L — ABNORMAL LOW (ref 20.0–28.0)
Calcium, Ion: 1.31 mmol/L (ref 1.15–1.40)
HCT: 57 % — ABNORMAL HIGH (ref 39.0–52.0)
Hemoglobin: 19.4 g/dL — ABNORMAL HIGH (ref 13.0–17.0)
O2 Saturation: 41 %
Potassium: 4.1 mmol/L (ref 3.5–5.1)
Sodium: 132 mmol/L — ABNORMAL LOW (ref 135–145)
TCO2: 9 mmol/L — ABNORMAL LOW (ref 22–32)
pCO2, Ven: 29 mmHg — ABNORMAL LOW (ref 44–60)
pH, Ven: 7.078 — CL (ref 7.25–7.43)
pO2, Ven: 32 mmHg (ref 32–45)

## 2021-11-06 LAB — GLUCOSE, CAPILLARY
Glucose-Capillary: 132 mg/dL — ABNORMAL HIGH (ref 70–99)
Glucose-Capillary: 89 mg/dL (ref 70–99)
Glucose-Capillary: 94 mg/dL (ref 70–99)
Glucose-Capillary: 168 mg/dL — ABNORMAL HIGH (ref 70–99)
Glucose-Capillary: 160 mg/dL — ABNORMAL HIGH (ref 70–99)

## 2021-11-06 LAB — OSMOLALITY: Osmolality: 294 mOsm/kg (ref 275–295)

## 2021-11-06 LAB — PHOSPHORUS: Phosphorus: 1.4 mg/dL — ABNORMAL LOW (ref 2.5–4.6)

## 2021-11-06 LAB — RESP PANEL BY RT-PCR (FLU A&B, COVID) ARPGX2
Influenza A by PCR: NEGATIVE
Influenza B by PCR: NEGATIVE
SARS Coronavirus 2 by RT PCR: NEGATIVE

## 2021-11-06 LAB — MAGNESIUM: Magnesium: 1.8 mg/dL (ref 1.7–2.4)

## 2021-11-06 LAB — LIPASE, BLOOD: Lipase: 22 U/L (ref 11–51)

## 2021-11-06 LAB — HIV ANTIBODY (ROUTINE TESTING W REFLEX): HIV Screen 4th Generation wRfx: NONREACTIVE

## 2021-11-06 LAB — BETA-HYDROXYBUTYRIC ACID: Beta-Hydroxybutyric Acid: 6.37 mmol/L — ABNORMAL HIGH (ref 0.05–0.27)

## 2021-11-06 MED ORDER — INSULIN ASPART 100 UNIT/ML IJ SOLN
3.0000 [IU] | Freq: Three times a day (TID) | INTRAMUSCULAR | Status: DC
  Administered 2021-11-07: 3 [IU] via SUBCUTANEOUS

## 2021-11-06 MED ORDER — IOHEXOL 300 MG/ML  SOLN
75.0000 mL | Freq: Once | INTRAMUSCULAR | Status: AC | PRN
  Administered 2021-11-06: 75 mL via INTRAVENOUS

## 2021-11-06 MED ORDER — LACTATED RINGERS IV BOLUS
1000.0000 mL | Freq: Once | INTRAVENOUS | Status: AC
  Administered 2021-11-06: 1000 mL via INTRAVENOUS

## 2021-11-06 MED ORDER — INSULIN REGULAR(HUMAN) IN NACL 100-0.9 UT/100ML-% IV SOLN
INTRAVENOUS | Status: DC
  Administered 2021-11-06: 4.85 [IU]/h via INTRAVENOUS
  Filled 2021-11-06: qty 100

## 2021-11-06 MED ORDER — INSULIN GLARGINE-YFGN 100 UNIT/ML ~~LOC~~ SOLN
27.0000 [IU] | SUBCUTANEOUS | Status: DC
  Administered 2021-11-06: 27 [IU] via SUBCUTANEOUS
  Filled 2021-11-06 (×2): qty 0.27

## 2021-11-06 MED ORDER — DEXTROSE IN LACTATED RINGERS 5 % IV SOLN: INTRAVENOUS | Status: DC

## 2021-11-06 MED ORDER — METOCLOPRAMIDE HCL 5 MG/ML IJ SOLN
5.0000 mg | Freq: Once | INTRAMUSCULAR | Status: AC
  Administered 2021-11-06: 5 mg via INTRAVENOUS
  Filled 2021-11-06: qty 2

## 2021-11-06 MED ORDER — LACTATED RINGERS IV SOLN: INTRAVENOUS | Status: DC

## 2021-11-06 MED ORDER — DEXTROSE 50 % IV SOLN: 0.0000 mL | INTRAVENOUS | Status: DC | PRN

## 2021-11-06 MED ORDER — MORPHINE SULFATE (PF) 4 MG/ML IV SOLN
4.0000 mg | Freq: Once | INTRAVENOUS | Status: AC
  Administered 2021-11-06: 4 mg via INTRAVENOUS
  Filled 2021-11-06: qty 1

## 2021-11-06 MED ORDER — SODIUM CHLORIDE 0.9 % IV BOLUS: 1000.0000 mL | Freq: Once | INTRAVENOUS | Status: DC

## 2021-11-06 MED ORDER — POTASSIUM CHLORIDE 10 MEQ/100ML IV SOLN
10.0000 meq | INTRAVENOUS | Status: AC
  Administered 2021-11-06 (×2): 10 meq via INTRAVENOUS
  Filled 2021-11-06 (×2): qty 100

## 2021-11-06 MED ORDER — POTASSIUM CHLORIDE CRYS ER 20 MEQ PO TBCR
40.0000 meq | EXTENDED_RELEASE_TABLET | Freq: Once | ORAL | Status: AC
  Administered 2021-11-06: 40 meq via ORAL
  Filled 2021-11-06: qty 2

## 2021-11-06 MED ORDER — POTASSIUM CHLORIDE 10 MEQ/100ML IV SOLN
10.0000 meq | INTRAVENOUS | Status: AC
  Administered 2021-11-06 (×4): 10 meq via INTRAVENOUS
  Filled 2021-11-06 (×4): qty 100

## 2021-11-06 MED ORDER — INSULIN ASPART 100 UNIT/ML IJ SOLN
0.0000 [IU] | Freq: Three times a day (TID) | INTRAMUSCULAR | Status: DC
  Administered 2021-11-07: 3 [IU] via SUBCUTANEOUS
  Administered 2021-11-07: 9 [IU] via SUBCUTANEOUS

## 2021-11-06 MED ORDER — INSULIN REGULAR(HUMAN) IN NACL 100-0.9 UT/100ML-% IV SOLN
INTRAVENOUS | Status: DC
Start: 2021-11-06 — End: 2021-11-06

## 2021-11-06 MED ORDER — INSULIN ASPART 100 UNIT/ML IJ SOLN
0.0000 [IU] | Freq: Every day | INTRAMUSCULAR | Status: DC
  Administered 2021-11-07: 3 [IU] via SUBCUTANEOUS

## 2021-11-06 MED ORDER — ENOXAPARIN SODIUM 40 MG/0.4ML IJ SOSY: 40.0000 mg | PREFILLED_SYRINGE | INTRAMUSCULAR | Status: DC

## 2021-11-06 NOTE — ED Notes (Signed)
CBG collected. Result "307." RN, [place], notified.

## 2021-11-06 NOTE — ED Provider Notes (Signed)
[facility] Provider Note   CSN: [id number] Arrival date & time: 11/06/21  1024     History  Chief Complaint  Patient presents with   Abdominal Pain   Emesis    Toure Edmonds is a 21 y.o. male.  HPI   Pt is a 21 y/o male wit ha h/o DM, social problem, anxiety, asthma, auditory hallucinations, cannabis hyperemesis, depression, ptsd, who presents to the ED c/o NV that started after he  drank etoh excessively 2 days ago. He missed his insulin yesterday because he was sleeping all day. Reports diffuse abd pain, denies diarrhea, fevers. Does have intermittent chills.   Home Medications Prior to Admission medications   Medication Sig Start Date End Date Taking? Authorizing Provider  Accu-Chek FastClix Lancets [place] TEST SIX TIMES DAILY 01/10/21   Sherrlyn Hock, MD  ACCU-CHEK GUIDE test strip TEST BLOOD SUGAR 8 TIMES DAILY 05/09/21   Sherrlyn Hock, MD  BD PEN NEEDLE NANO 2ND GEN 32G X 4 MM MISC USE 1 NEEDLE 6 TIMES DAILY 10/21/21   Sherrlyn Hock, MD  Blood Glucose Monitoring Suppl (ACCU-CHEK GUIDE) w/Device KIT 1 Units by Does not apply route daily. 05/04/17   Sherrlyn Hock, MD  Glucagon (BAQSIMI TWO PACK) 3 MG/DOSE POWD Place 1 application into the nose as needed. Use as directed if unconscious, unable to take food po, or having a seizure due to hypoglycemia 02/04/20   Levon Hedger, MD  glucagon (GLUCAGON EMERGENCY) 1 MG injection INJECT 1 MG IN THE MUSCLE OF THIGH IF UNRESPONSIVE, UNABLE TO SWALLOW, UNCONSCIOUS AND/OR HAS SEIZURE 08/11/19   Sherrlyn Hock, MD  insulin aspart (NOVOLOG) 100 UNIT/ML FlexPen Inject 8 Units into the skin 3 (three) times daily with meals. Add on additional # units per sliding scale instructions. 05/02/21   Nicole Kindred A, DO  insulin glargine (LANTUS) 100 UNIT/ML Solostar Pen Inject 45 Units into the skin daily. 05/02/21   Ezekiel Slocumb, DO  Insulin Pen Needle 32G X 4 MM MISC Use as  directed 05/02/21   Nicole Kindred A, DO  ketoconazole (NIZORAL) 2 % cream APPLY TO FEET TWICE DAILY FOR 1 MONTH THEN USE DAILY UNTIL FUNGAS CLEARS COMPLETELY 10/21/21   Sherrlyn Hock, MD  Urine Glucose-Ketones Test STRP 1 each by Other route as needed. 09/08/15   Lelon Huh, MD      Allergies    Amoxicillin and Penicillins    Review of Systems   Review of Systems See HPI for pertinent positives or negatives.   Physical Exam Updated Vital Signs BP 127/88    Pulse 81    Temp 98.6 F (37 C) (Oral)    Resp (!) 9    SpO2 100%  Physical Exam Vitals and nursing note reviewed.  Constitutional:      General: He is not in acute distress.    Appearance: He is well-developed.     Comments: Thin appearing male  HENT:     Head: Normocephalic and atraumatic.     Mouth/Throat:     Mouth: Mucous membranes are dry.  Eyes:     Conjunctiva/sclera: Conjunctivae normal.  Cardiovascular:     Rate and Rhythm: Regular rhythm. Tachycardia present.     Heart sounds: No murmur heard. Pulmonary:     Effort: Pulmonary effort is normal. No respiratory distress.     Breath sounds: No wheezing, rhonchi or rales.  Abdominal:     General: Bowel sounds are normal.  Palpations: Abdomen is soft.     Tenderness: There is abdominal tenderness in the right lower quadrant. There is guarding.  Musculoskeletal:        General: No swelling.     Cervical back: Neck supple.  Skin:    General: Skin is warm and dry.     Capillary Refill: Capillary refill takes less than 2 seconds.  Neurological:     Mental Status: He is alert.  Psychiatric:        Mood and Affect: Mood normal.     ED Results / Procedures / Treatments   Labs (all labs ordered are listed, but only abnormal results are displayed) Labs Reviewed  CBC - Abnormal; Notable for the following components:      Result Value   WBC 10.9 (*)    RBC 6.61 (*)    Hemoglobin 17.4 (*)    HCT 56.7 (*)    All other components within normal limits   BETA-HYDROXYBUTYRIC ACID - Abnormal; Notable for the following components:   Beta-Hydroxybutyric Acid 6.37 (*)    All other components within normal limits  COMPREHENSIVE METABOLIC PANEL - Abnormal; Notable for the following components:   Sodium 132 (*)    Potassium 2.9 (*)    CO2 8 (*)    Glucose, Bld 240 (*)    BUN 5 (*)    Calcium 7.2 (*)    Total Protein 5.6 (*)    AST 8 (*)    All other components within normal limits  CBG MONITORING, ED - Abnormal; Notable for the following components:   Glucose-Capillary 358 (*)    All other components within normal limits  CBG MONITORING, ED - Abnormal; Notable for the following components:   Glucose-Capillary 249 (*)    All other components within normal limits  I-STAT VENOUS BLOOD GAS, ED - Abnormal; Notable for the following components:   pH, Ven 7.078 (*)    pCO2, Ven 29.0 (*)    Bicarbonate 8.6 (*)    TCO2 9 (*)    Acid-base deficit 20.0 (*)    Sodium 132 (*)    HCT 57.0 (*)    Hemoglobin 19.4 (*)    All other components within normal limits  CBG MONITORING, ED - Abnormal; Notable for the following components:   Glucose-Capillary 281 (*)    All other components within normal limits  RESP PANEL BY RT-PCR (FLU A&B, COVID) ARPGX2  OSMOLALITY  LIPASE, BLOOD  URINALYSIS, ROUTINE W REFLEX MICROSCOPIC  BASIC METABOLIC PANEL    EKG EKG Interpretation  Date/Time:  Sunday November 06 2021 10:44:02 EST Ventricular Rate:  97 PR Interval:  119 QRS Duration: 81 QT Interval:  337 QTC Calculation: 428 R Axis:   78 Text Interpretation: Sinus rhythm Borderline short PR interval Right atrial enlargement No significant change since last tracing Confirmed by Calvert Cantor [phone]) on 11/06/2021 11:06:37 AM  Radiology CT ABDOMEN PELVIS W CONTRAST  Result Date: 11/06/2021 CLINICAL DATA:  21 year old male with acute RIGHT abdominal and pelvic pain with nausea and vomiting for 2 days. EXAM: CT ABDOMEN AND PELVIS WITH CONTRAST TECHNIQUE:  Multidetector CT imaging of the abdomen and pelvis was performed using the standard protocol following bolus administration of intravenous contrast. RADIATION DOSE REDUCTION: This exam was performed according to the departmental dose-optimization program which includes automated exposure control, adjustment of the mA and/or kV according to patient size and/or use of iterative reconstruction technique. CONTRAST:  61m OMNIPAQUE IOHEXOL 300 MG/ML  SOLN COMPARISON:  06/05/2017 CT  FINDINGS: Lower chest: Unremarkable Hepatobiliary: The liver and gallbladder are unremarkable. There is no evidence of intrahepatic or extrahepatic biliary dilatation. Pancreas: Unremarkable Spleen: Unremarkable Adrenals/Urinary Tract: The kidneys, adrenal glands and bladder are unremarkable. Stomach/Bowel: Stomach is within normal limits. The appendix is not identified due to paucity of fat, but there are no findings to suggest appendicitis. No evidence of bowel wall thickening, distention, or inflammatory changes. Vascular/Lymphatic: No significant vascular findings are present. No enlarged abdominal or pelvic lymph nodes. Reproductive: Prostate is unremarkable. Other: No ascites, focal collection or pneumoperitoneum. Musculoskeletal: No acute or suspicious bony abnormalities are noted. IMPRESSION: No evidence of acute or significant abnormality. The appendix is not identified but there are no findings to suggest appendicitis. Electronically Signed   By: Margarette Canada M.D.   On: 11/06/2021 13:22    Procedures .Critical Care Performed by: Rodney Booze, PA-C Authorized by: Rodney Booze, PA-C   Critical care provider statement:    Critical care time (minutes):  37   Critical care time was exclusive of:  Separately billable procedures and treating other patients and teaching time   Critical care was necessary to treat or prevent imminent or life-threatening deterioration of the following conditions:  Endocrine crisis    Critical care was time spent personally by me on the following activities:  Development of treatment plan with patient or surrogate, discussions with consultants, evaluation of patient's response to treatment, examination of patient, ordering and review of laboratory studies, ordering and review of radiographic studies, ordering and performing treatments and interventions, pulse oximetry, re-evaluation of patient's condition and review of old charts   I assumed direction of critical care for this patient from another provider in my specialty: no     Care discussed with: admitting provider      Medications Ordered in ED Medications  insulin regular, human (MYXREDLIN) 100 units/ 100 mL infusion (7.5 Units/hr Intravenous Rate/Dose Change 11/06/21 1338)  lactated ringers infusion ( Intravenous Not Given 11/06/21 1233)  dextrose 5 % in lactated ringers infusion ( Intravenous New Bag/Given 11/06/21 1232)  dextrose 50 % solution 0-50 mL (has no administration in time range)  potassium chloride 10 mEq in 100 mL IVPB (10 mEq Intravenous New Bag/Given 11/06/21 1337)  potassium chloride 10 mEq in 100 mL IVPB (has no administration in time range)  lactated ringers bolus 1,000 mL (0 mLs Intravenous Stopped 11/06/21 1156)  morphine (PF) 4 MG/ML injection 4 mg (4 mg Intravenous Given 11/06/21 1122)  metoCLOPramide (REGLAN) injection 5 mg (5 mg Intravenous Given 11/06/21 1122)  lactated ringers bolus 1,000 mL (0 mLs Intravenous Stopped 11/06/21 1340)  iohexol (OMNIPAQUE) 300 MG/ML solution 75 mL (75 mLs Intravenous Contrast Given 11/06/21 1307)    ED Course/ Medical Decision Making/ A&P                           Medical Decision Making Amount and/or Complexity of Data Reviewed Labs: ordered. Radiology: ordered.  Risk Prescription drug management.   This patient presents to the ED for concern of nv, abd pain, this involves an extensive number of treatment options, and is a complaint that carries with it a high  risk of complications and morbidity.  The differential diagnosis includes but is not limited to dka, cannabis hyperemesis, gastritis/PUD, enteritis/duodenitis, appendicitis, cholelithiasis/cholecystitis, cholangitis, pancreatitis, ruptured viscus, colitis, diverticulitis, proctitis, cystitis, pyelonephritis, ureteral colic, aortic dissection, aortic aneurysm.   Comorbidities that complicate the patient evaluation: Patients presentation is complicated by their  history of DM, substance use, noncompliance  Social Determinants of Health: Patients  noncompliance   increases the complexity of managing their presentation  Additional history obtained: Records reviewed previous admission documents and Care Everywhere/External Records  Lab Tests: I Ordered, and personally interpreted labs.  The pertinent results include:   CBC with mild leukocytosis, elevated hgb likely from hemoconcentration CMP with pseudohyponatremia, low k, low bicarb, elevated blood glucose, normal cr and lfts Lipase negative VBG with pH of 7.0, bicarb 8 Osmolarity wnl Beta hydroxybutyric acid - elevated at 6 UA pending on admission   EKG - nsr, borderline short pr interval, rae  Imaging Studies ordered: I ordered, independently visualized, and interpreted imaging which showed  CT abd/pelvis - neg, no findings to suggest appendicitis or other surgical or emergent etiology of sxs  I agree with the radiologist interpretation  Cardiac Monitoring: The patient was maintained on a cardiac monitor.  I personally viewed and interpreted the cardiac monitor which showed an underlying rhythm of:  sinus tachycardia  Medicines ordered and prescription drug management: I ordered medication including insulin drip, iv potassium, zofran, ivf  for hyperglycemia/dka, nv, dehydration  Reevaluation of the patient after these medicines showed that the patient    improved  Critical Interventions: insulin drip, iv potassium,  ivf   Complexity of problems addressed: Patients presentation is most consistent with  acute presentation with potential threat to life or bodily function  Disposition: After consideration of the diagnostic results and the patients response to treatment,  I feel that the patent would benefit from admission for further tx of DKA .   Consultations Obtained: 1:33 PM I consulted with the consultant Jerene Pitch, APP with critical care, and discussed  findings as well as pertinent plan. She reviewed the chart with her attending and does not feel that the patient needs to be admitted to critical care however hospitalist is welcome to reconsult if hemodynamics change.  1:58 PM I consulted with Dr. Johnney Ou with internal medicine service who accepts patient for admission   Final Clinical Impression(s) / ED Diagnoses Final diagnoses:  Diabetic ketoacidosis without coma associated with type 1 diabetes mellitus [facility])    Rx / DC Orders ED Discharge Orders     None         Rodney Booze, PA-C 11/06/21 1400    Truddie Hidden, MD 11/06/21 1513

## 2021-11-06 NOTE — ED Notes (Signed)
This RN attempted X 2 to straight stick for BMP and Mag without success. Lab called to attempt.
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
## 2021-11-06 NOTE — ED Notes (Signed)
Pt back to room from CT via stretcher at this time.

## 2021-11-06 NOTE — ED Triage Notes (Signed)
Pt reports RLQ pain, nausea, and vomiting since smoking marijuana 2 days ago.  Reports history of cannabis hyperemesis and diabetes.

## 2021-11-06 NOTE — H&P (Addendum)
Date: 11/06/2021               Patient Name:  Keith [name] MRN: [id number]  DOB: [birth year] Age / Sex: 21 y.o., male   PCP: Sanger, Dalbert Batman, DO         Medical Service: Internal Medicine Teaching Service         Attending Physician: Dr. Mikey Bussing, Marthenia Rolling, DO    First Contact: Champ Mungo, DO Pager: ED [phone]  Second Contact: Thalia Bloodgood, DO Pager: Theresa Duty [phone]       After Hours (After 5p/  First Contact Pager: [phone]  weekends / holidays): Second Contact Pager: [phone]   SUBJECTIVE   Chief Complaint: Weakness, vomiting, elevated blood sugar  History of Present Illness: Keith [name] is a 21 year old male with past medical history of type 1 diabetes, asthma, bipolar disorder who presents with the chief complaint of nausea and vomiting.  He states that 2 nights ago he drank a large amount of alcohol because he was angry at his grandfather and missed taking his insulin the next day.  He has been asleep for the majority of the last day and missed all medications as a result.  He has been nauseous with vomiting and no p.o. intake.  Prior to consuming a large amount of alcohol 2 nights ago he did smoke marijuana.  He states that normally he only drinks 2 shots of alcohol every few months. He does endorse depression however denies SI at this time.  Initial presentation he also complained of abdominal pain that has since resolved.  He has been urinating frequently and large volumes with increased thirst, stating that today he has had at least 2 large beverages prior to coming in.  He has also been weak with chills but has not had a fever, headaches.  He is urinating without problem but has not had a bowel movement in a few days.  When he checked his sugar this morning it was 433.  He states that he has been hospitalized several times for his diabetes and is familiar with DKA.  He recalls his most recent HbA1c to be 14%.  He did used to have a CGM but broke it and threw it away  because he did not know that he could have a replaced.  He base his mealtime insulin on carb counting with carb ratio of 10.  ED Course:Patient has been managed thus far with LR, K replacement, several re-evaluations of electrolyte derangements; he had a complaint of lower abdominal pain prompting CT abdomen/pelvis which was not concerning for acute processes and the pain had resolved prior to my assessment.  Meds:  Novolog SSI Lantus 50 units nightly Accucheck  Past Medical History:  Diagnosis Date   Anxiety    Asthma    Auditory hallucinations    Cannabis hyperemesis syndrome concurrent with and due to cannabis abuse (HCC)    Depression    Diabetes mellitus without complication (HCC) 08/03/2015   New onset   Obesity    PTSD (post-traumatic stress disorder)    Social problem 08/03/2015   Unintentional weight loss 06/07/2017    Past Surgical History:  Procedure Laterality Date   FRACTURE SURGERY     Right arm    Social:  Lives With: His grandpa. Occupation: Consulting civil engineer, various part-time jobs. Support: Family in the area. Level of Function: Able to perform ADL's/IADL's. PCP: Camelia Phenes, DO Substances: Rare alcohol use, marijuana use daily. Occasional tobacco use.  Family History:  Mother and Grandmother  with Diabetes Grandmother had cancer Uncertain of some of his family members PMHx  Allergies: Allergies as of 11/06/2021 - Review Complete 11/06/2021  Allergen Reaction Noted   Amoxicillin Hives and Rash 08/03/2015   Penicillins Hives and Rash 05/16/2017    Review of Systems: A complete ROS was negative except as per HPI.   OBJECTIVE:   Physical Exam: Blood pressure 113/72, pulse 87, temperature 98.6 F (37 C), temperature source Oral, resp. rate 10, SpO2 100 %.  Constitutional: Initially drowsy with difficulty waking and responding with gradual improvement in mentation. Cardio: Tachycardic with regular rhythm.  No murmurs, rubs, gallops Pulm: Clear to  auscultation bilaterally.  Work of breathing on room air. Abdomen: Soft, nontender, suprapubic fullness. MSK: Negative for extreme edema. Skin: Warm and dry. Neuro: Alert and oriented x3.  No focal deficit noted. Psych: Normal mood and affect. No SI.  Labs: CBC    Component Value Date/Time   WBC 10.9 (H) 11/06/2021 1033   RBC 6.61 (H) 11/06/2021 1033   HGB 16.7 11/06/2021 1455   HCT 49.0 11/06/2021 1455   PLT 293 11/06/2021 1033   MCV 85.8 11/06/2021 1033   MCH 26.3 11/06/2021 1033   MCHC 30.7 11/06/2021 1033   RDW 13.3 11/06/2021 1033   LYMPHSABS 1.0 04/29/2021 1710   MONOABS 1.3 (H) 04/29/2021 1710   EOSABS 0.0 04/29/2021 1710   BASOSABS 0.1 04/29/2021 1710     CMP     Component Value Date/Time   NA 134 (L) 11/06/2021 1455   K 3.9 11/06/2021 1455   CL 120 (H) 11/06/2021 1342   CO2 8 (L) 11/06/2021 1342   GLUCOSE 285 (H) 11/06/2021 1342   BUN <5 (L) 11/06/2021 1342   CREATININE 0.51 (L) 11/06/2021 1342   CREATININE 0.76 10/04/2021 0909   CALCIUM 4.4 (LL) 11/06/2021 1342   PROT 5.6 (L) 11/06/2021 1136   ALBUMIN 3.5 11/06/2021 1136   AST 8 (L) 11/06/2021 1136   ALT 9 11/06/2021 1136   ALKPHOS 69 11/06/2021 1136   BILITOT 0.7 11/06/2021 1136   GFRNONAA >60 11/06/2021 1342   GFRAA NOT CALCULATED 05/16/2020 1216    Imaging: CT ABDOMEN PELVIS W CONTRAST  Result Date: 11/06/2021 CLINICAL DATA:  21 year old male with acute RIGHT abdominal and pelvic pain with nausea and vomiting for 2 days. EXAM: CT ABDOMEN AND PELVIS WITH CONTRAST TECHNIQUE: Multidetector CT imaging of the abdomen and pelvis was performed using the standard protocol following bolus administration of intravenous contrast. RADIATION DOSE REDUCTION: This exam was performed according to the departmental dose-optimization program which includes automated exposure control, adjustment of the mA and/or kV according to patient size and/or use of iterative reconstruction technique. CONTRAST:  68mL OMNIPAQUE IOHEXOL  300 MG/ML  SOLN COMPARISON:  06/05/2017 CT FINDINGS: Lower chest: Unremarkable Hepatobiliary: The liver and gallbladder are unremarkable. There is no evidence of intrahepatic or extrahepatic biliary dilatation. Pancreas: Unremarkable Spleen: Unremarkable Adrenals/Urinary Tract: The kidneys, adrenal glands and bladder are unremarkable. Stomach/Bowel: Stomach is within normal limits. The appendix is not identified due to paucity of fat, but there are no findings to suggest appendicitis. No evidence of bowel wall thickening, distention, or inflammatory changes. Vascular/Lymphatic: No significant vascular findings are present. No enlarged abdominal or pelvic lymph nodes. Reproductive: Prostate is unremarkable. Other: No ascites, focal collection or pneumoperitoneum. Musculoskeletal: No acute or suspicious bony abnormalities are noted. IMPRESSION: No evidence of acute or significant abnormality. The appendix is not identified but there are no findings to suggest appendicitis. Electronically Signed  By: Harmon Pier M.D.   On: 11/06/2021 13:22    EKG: personally reviewed my interpretation is sinus rhythm 97 bpm, PR interval 119, RA enlargement.   ASSESSMENT & PLAN:   Assessment & Plan by Problem: Principal Problem:   DKA (diabetic ketoacidosis) (HCC)   Keith [name] is a 21 year old male with past medical history of type 1 diabetes, asthma, bipolar disorder who presents with the chief complaint of nausea and vomiting and admitted for DKA on hospital day 0.  #DKA #T1DM Patient reports last known HbA1c of 14%. He doses mealtime insulin based on carb ratios however after a binge drinking episode 02/17 he missed his insulin doses and PO intake on 02/18. Home BG on 433 prior to ED arrival. On presentation patient had nausea with vomiting, severe thirst and frequent urination, and abdominal pain. CBG was 358; urinalysis significant for greater than 500 glucose, trace hemoglobin, greater than 80 ketones, 100  protein; beta hydroxybutyric acid elevated at 6.37; VBG significant for pH 7.078, PCO2 29, PO2 32 bicarb 8.6, T CO2 9, acid base deficit 20.  He was given LR 2 L and started on LR 125/h,  K was corrected with 10 mEq IV x4, and several metabolic panel results concerning for severe derangements.  At this time his CBG has improved to 132 and he has had gap closed x1. He has had resolution of nausea, vomiting, and abdominal pain as well. -Transition to D5 LR at 125 mL/h. He will need an additional lab showing gap closure prior to progressing diet. pH not low enough for sodium bicarbonate. Continuous IV insulin with goal 150-200. K goal >3.3. Once he is able to eat he will need basal insulin in addition to infusion for 2-4 hours, then can discontinue infusion. -BMP q4h -CBG q1h -F/u phos  #Hx bipolar disorder #Depression Patient reports taking a medication for bipolar disorder but does not recall the name, states it is a blue pill.  He reports having an established therapist as an outpatient that he works well with. -Will speak with pharmacy team in the morning for assistance in identifying medication -Will need continued OP follow-up with his therapist  #Hx asthma Patient states that he has not had an asthma exacerbation in the recent past. He has an albuterol inhaler for as needed use but has not used it recently. -CTM  Diet: NPO VTE: SCDs IVF: LR,125cc/hr; change to D5-LR at 125 mL/h when CBG <250 Code: Full  Prior to Admission Living Arrangement: Home, living with family Anticipated Discharge Location: Home Barriers to Discharge: Medical spillage  Dispo: Admit patient to Observation with expected length of stay less than 2 midnights.  Signed: Champ Mungo, DO Internal Medicine Resident PGY-2 Pager: [phone]  11/06/2021, 6:04 PM

## 2021-11-06 NOTE — ED Notes (Signed)
Mellody Dance, EMT in minilab to send down dark green top to lab after istat finished.

## 2021-11-06 NOTE — ED Notes (Signed)
Pt transported to CT via stretcher at this time.  

## 2021-11-06 NOTE — ED Notes (Signed)
Cortni Couture PA made aware of VBG results ED-Lab.

## 2021-11-06 NOTE — ED Notes (Signed)
RT notified of need for ABG.

## 2021-11-07 DIAGNOSIS — Z20822 Contact with and (suspected) exposure to covid-19: Secondary | ICD-10-CM | POA: Diagnosis present

## 2021-11-07 DIAGNOSIS — Z8659 Personal history of other mental and behavioral disorders: Secondary | ICD-10-CM | POA: Diagnosis not present

## 2021-11-07 DIAGNOSIS — F419 Anxiety disorder, unspecified: Secondary | ICD-10-CM | POA: Diagnosis present

## 2021-11-07 DIAGNOSIS — F1290 Cannabis use, unspecified, uncomplicated: Secondary | ICD-10-CM | POA: Diagnosis present

## 2021-11-07 DIAGNOSIS — Z809 Family history of malignant neoplasm, unspecified: Secondary | ICD-10-CM | POA: Diagnosis not present

## 2021-11-07 DIAGNOSIS — F319 Bipolar disorder, unspecified: Secondary | ICD-10-CM

## 2021-11-07 DIAGNOSIS — Z68.41 Body mass index (BMI) pediatric, less than 5th percentile for age: Secondary | ICD-10-CM | POA: Diagnosis not present

## 2021-11-07 DIAGNOSIS — J45909 Unspecified asthma, uncomplicated: Secondary | ICD-10-CM | POA: Diagnosis present

## 2021-11-07 DIAGNOSIS — E876 Hypokalemia: Secondary | ICD-10-CM | POA: Diagnosis present

## 2021-11-07 DIAGNOSIS — R110 Nausea: Secondary | ICD-10-CM

## 2021-11-07 DIAGNOSIS — Z833 Family history of diabetes mellitus: Secondary | ICD-10-CM | POA: Diagnosis not present

## 2021-11-07 DIAGNOSIS — Z8709 Personal history of other diseases of the respiratory system: Secondary | ICD-10-CM | POA: Diagnosis not present

## 2021-11-07 DIAGNOSIS — E1010 Type 1 diabetes mellitus with ketoacidosis without coma: Principal | ICD-10-CM

## 2021-11-07 DIAGNOSIS — Z794 Long term (current) use of insulin: Secondary | ICD-10-CM | POA: Diagnosis not present

## 2021-11-07 DIAGNOSIS — Z87891 Personal history of nicotine dependence: Secondary | ICD-10-CM | POA: Diagnosis not present

## 2021-11-07 DIAGNOSIS — F4310 Post-traumatic stress disorder, unspecified: Secondary | ICD-10-CM | POA: Diagnosis present

## 2021-11-07 DIAGNOSIS — R109 Unspecified abdominal pain: Secondary | ICD-10-CM | POA: Diagnosis present

## 2021-11-07 LAB — BASIC METABOLIC PANEL
Anion gap: 8 (ref 5–15)
Anion gap: 9 (ref 5–15)
Anion gap: 8 (ref 5–15)
Anion gap: 7 (ref 5–15)
Anion gap: 6 (ref 5–15)
Anion gap: 6 (ref 5–15)
BUN: <5 mg/dL — ABNORMAL LOW (ref 6–20)
BUN: <5 mg/dL — ABNORMAL LOW (ref 6–20)
BUN: <5 mg/dL — ABNORMAL LOW (ref 6–20)
BUN: <5 mg/dL — ABNORMAL LOW (ref 6–20)
BUN: 5 mg/dL — ABNORMAL LOW (ref 6–20)
BUN: 8 mg/dL (ref 6–20)
CO2: 16 mmol/L — ABNORMAL LOW (ref 22–32)
CO2: 17 mmol/L — ABNORMAL LOW (ref 22–32)
CO2: 18 mmol/L — ABNORMAL LOW (ref 22–32)
CO2: 20 mmol/L — ABNORMAL LOW (ref 22–32)
CO2: 25 mmol/L (ref 22–32)
CO2: 28 mmol/L (ref 22–32)
Calcium: 8.4 mg/dL — ABNORMAL LOW (ref 8.9–10.3)
Calcium: 8.4 mg/dL — ABNORMAL LOW (ref 8.9–10.3)
Calcium: 8.3 mg/dL — ABNORMAL LOW (ref 8.9–10.3)
Calcium: 8.3 mg/dL — ABNORMAL LOW (ref 8.9–10.3)
Calcium: 8.5 mg/dL — ABNORMAL LOW (ref 8.9–10.3)
Calcium: 8.7 mg/dL — ABNORMAL LOW (ref 8.9–10.3)
Chloride: 107 mmol/L (ref 98–111)
Chloride: 106 mmol/L (ref 98–111)
Chloride: 107 mmol/L (ref 98–111)
Chloride: 104 mmol/L (ref 98–111)
Chloride: 103 mmol/L (ref 98–111)
Chloride: 102 mmol/L (ref 98–111)
Creatinine, Ser: 0.66 mg/dL (ref 0.61–1.24)
Creatinine, Ser: 0.74 mg/dL (ref 0.61–1.24)
Creatinine, Ser: 0.63 mg/dL (ref 0.61–1.24)
Creatinine, Ser: 0.63 mg/dL (ref 0.61–1.24)
Creatinine, Ser: 0.87 mg/dL (ref 0.61–1.24)
Creatinine, Ser: 0.64 mg/dL (ref 0.61–1.24)
GFR, Estimated: >60 mL/min (ref >60)
GFR, Estimated: >60 mL/min (ref >60)
GFR, Estimated: >60 mL/min (ref >60)
GFR, Estimated: >60 mL/min (ref >60)
GFR, Estimated: >60 mL/min (ref >60)
GFR, Estimated: >60 mL/min (ref >60)
Glucose, Bld: 171 mg/dL — ABNORMAL HIGH (ref 70–99)
Glucose, Bld: 262 mg/dL — ABNORMAL HIGH (ref 70–99)
Glucose, Bld: 256 mg/dL — ABNORMAL HIGH (ref 70–99)
Glucose, Bld: 439 mg/dL — ABNORMAL HIGH (ref 70–99)
Glucose, Bld: 381 mg/dL — ABNORMAL HIGH (ref 70–99)
Glucose, Bld: 300 mg/dL — ABNORMAL HIGH (ref 70–99)
Potassium: 3.3 mmol/L — ABNORMAL LOW (ref 3.5–5.1)
Potassium: 3.8 mmol/L (ref 3.5–5.1)
Potassium: 3.6 mmol/L (ref 3.5–5.1)
Potassium: 3.6 mmol/L (ref 3.5–5.1)
Potassium: 3.9 mmol/L (ref 3.5–5.1)
Potassium: 3.9 mmol/L (ref 3.5–5.1)
Sodium: 131 mmol/L — ABNORMAL LOW (ref 135–145)
Sodium: 132 mmol/L — ABNORMAL LOW (ref 135–145)
Sodium: 133 mmol/L — ABNORMAL LOW (ref 135–145)
Sodium: 131 mmol/L — ABNORMAL LOW (ref 135–145)
Sodium: 134 mmol/L — ABNORMAL LOW (ref 135–145)
Sodium: 136 mmol/L (ref 135–145)

## 2021-11-07 LAB — GLUCOSE, CAPILLARY
Glucose-Capillary: 234 mg/dL — ABNORMAL HIGH (ref 70–99)
Glucose-Capillary: 229 mg/dL — ABNORMAL HIGH (ref 70–99)
Glucose-Capillary: 410 mg/dL — ABNORMAL HIGH (ref 70–99)
Glucose-Capillary: 352 mg/dL — ABNORMAL HIGH (ref 70–99)
Glucose-Capillary: 257 mg/dL — ABNORMAL HIGH (ref 70–99)

## 2021-11-07 LAB — PHOSPHORUS: Phosphorus: 1.9 mg/dL — ABNORMAL LOW (ref 2.5–4.6)

## 2021-11-07 MED ORDER — INSULIN ASPART 100 UNIT/ML IJ SOLN
5.0000 [IU] | Freq: Three times a day (TID) | INTRAMUSCULAR | Status: DC
  Administered 2021-11-07: 5 [IU] via SUBCUTANEOUS

## 2021-11-07 MED ORDER — INSULIN ASPART 100 UNIT/ML IJ SOLN
0.0000 [IU] | Freq: Three times a day (TID) | INTRAMUSCULAR | Status: DC
  Administered 2021-11-07: 15 [IU] via SUBCUTANEOUS
  Administered 2021-11-08 (×2): 8 [IU] via SUBCUTANEOUS

## 2021-11-07 MED ORDER — ONDANSETRON HCL 4 MG/2ML IJ SOLN: 4.0000 mg | Freq: Three times a day (TID) | INTRAMUSCULAR | Status: DC | PRN

## 2021-11-07 MED ORDER — INSULIN ASPART 100 UNIT/ML IJ SOLN
10.0000 [IU] | Freq: Three times a day (TID) | INTRAMUSCULAR | Status: DC
  Administered 2021-11-07 – 2021-11-08 (×3): 10 [IU] via SUBCUTANEOUS

## 2021-11-07 MED ORDER — K PHOS MONO-SOD PHOS DI & MONO 155-852-130 MG PO TABS: 250.0000 mg | ORAL_TABLET | Freq: Once | ORAL | Status: DC

## 2021-11-07 MED ORDER — K PHOS MONO-SOD PHOS DI & MONO 155-852-130 MG PO TABS
250.0000 mg | ORAL_TABLET | Freq: Two times a day (BID) | ORAL | Status: DC
  Administered 2021-11-07 – 2021-11-08 (×3): 250 mg via ORAL
  Filled 2021-11-07 (×3): qty 1

## 2021-11-07 MED ORDER — INSULIN GLARGINE-YFGN 100 UNIT/ML ~~LOC~~ SOLN
35.0000 [IU] | SUBCUTANEOUS | Status: DC
  Filled 2021-11-07: qty 0.35

## 2021-11-07 MED ORDER — INSULIN GLARGINE-YFGN 100 UNIT/ML ~~LOC~~ SOLN
40.0000 [IU] | SUBCUTANEOUS | Status: DC
  Administered 2021-11-07: 40 [IU] via SUBCUTANEOUS
  Filled 2021-11-07 (×2): qty 0.4

## 2021-11-07 NOTE — Progress Notes (Addendum)
Inpatient Diabetes Program Recommendations  AACE/ADA: New Consensus Statement on Inpatient Glycemic Control (2015)  Target Ranges:  Prepandial:   less than 140 mg/dL      Peak postprandial:   less than 180 mg/dL (1-2 hours)      Critically ill patients:  140 - 180 mg/dL   Lab Results  Component Value Date   GLUCAP 229 (H) 11/07/2021   HGBA1C >14 10/04/2021    Review of Glycemic Control  Diabetes history: DM 2 Outpatient Diabetes medications: Lantus 45 units, Novolog 8 units tid Current orders for Inpatient glycemic control:  Semglee 27 units Novolog 0-9 units tid + hs  Novolog 3 units tid meal coverage  Inpatient Diabetes Program Recommendations:    -  Increase Semglee closer to home dose, 40 units, give additional insulin now.  - Increase Meal coverage to Novolog 5 units tid   Pt still sees the peds Endocrinologist Dr. [name]. Last visit was on 1/17. Education provided. Pt known to have noncompliance and also struggles psychologically. Will ultimately need counseling and stabilization before glucose control can be obtained.  Thanks,  Tama Headings RN, MSN, BC-ADM Inpatient Diabetes Coordinator Team Pager [phone] (8a-5p)

## 2021-11-07 NOTE — Progress Notes (Signed)
° °  HD#0 SUBJECTIVE:  Patient Summary: Keith [name] is a 21 year old male with past medical history of type 1 diabetes, asthma, bipolar disorder who presents with the chief complaint of nausea and vomiting and admitted for DKA.  Overnight Events: None  Interim History: Patient reports feeling better this morning and is hungry. He does still have some nausea but has not vomited.  OBJECTIVE:  Vital Signs: Vitals:   11/06/21 2054 11/07/21 0000 11/07/21 0400 11/07/21 0419  BP: 106/68 104/66 94/61 106/61  Pulse: 82 87 80 78  Resp: 13 14 12 14   Temp: 98.1 F (36.7 C) 98.2 F (36.8 C) 98.4 F (36.9 C)   TempSrc: Oral Oral Oral   SpO2: 99% 98% 99% 97%  Weight:   48.7 kg   Height:   5\' 5"  (1.651 m)    Supplemental O2: Room Air SpO2: 97 %  Filed Weights   11/07/21 0400  Weight: 48.7 kg     Intake/Output Summary (Last 24 hours) at 11/07/2021 0706 Last data filed at 11/07/2021 0327 Gross per 24 hour  Intake 5090.2 ml  Output 1800 ml  Net 3290.2 ml   Net IO Since Admission: 3,290.2 mL [11/07/21 0706]  Physical Exam: Constitutional:Pleasant gentleman resting comfortably in bed. No acute distress. Cardio:Regular rate and rhythm.  Pulm:Clear to auscultation bilaterally. Normal work of breathing on room air. Abdomen:Soft, nontender, nondistended. 11/09/2021 for extremity edema. Skin:Warm and dry. Neuro:Alert and oriented x3. No focal deficit noted. Psych:Normal mood and affect.  Patient Lines/Drains/Airways Status     Active Line/Drains/Airways     Name Placement date Placement time Site Days   Peripheral IV 11/06/21 18 G Right Antecubital 11/06/21  1058  Antecubital  1   Peripheral IV 11/06/21 20 G Right Forearm 11/06/21  1140  Forearm  1             ASSESSMENT/PLAN:  Assessment: Principal Problem:   DKA (diabetic ketoacidosis) (HCC)   Plan: Keith [name] is a 21 year old male with past medical history of type 1 diabetes, asthma, bipolar disorder who  presents with the chief complaint of nausea and vomiting and admitted for DKA.   #DKA #T1DM Patient has tolerated breakfast and lunch however most recent CBG>400. Insulin infusion was stopped overnight. He received Semglee 27 units overnight and was started on novolog 3 units + SSI with meals. Phosphorous 1.4.  -Trend BMP -CBG q4h -phosphorous 250 mg PO BID -Increase Semglee 35 units daily -Increase novoLOG to 10 units TID with meals -novoLOG SSI -F/u phos level -Zofran for nausea   #Hx bipolar disorder Patient reports taking a medication for bipolar disorder but does not recall the name, states it is a blue pill.   #Hx asthma Patient states that he has not had an asthma exacerbation in the recent past. He has an albuterol inhaler for as needed use but has not used it recently. -CTM  Best Practice: Diet: NPO VTE: Place and maintain sequential compression device Start: 11/06/21 1636 Code: Full AB: None DISPO: Anticipated discharge tomorrow to Home pending Medical stability.  Signature: 12, D.O.  Internal Medicine Resident, PGY-1 11/08/21 Internal Medicine Residency  Pager: [phone] 7:06 AM, 11/07/2021   Please contact the on call pager after 5 pm and on weekends at [phone].

## 2021-11-07 NOTE — Hospital Course (Addendum)
Type 1 diabetes mellitus DKA Patient presented after a binge drinking episode two days prior to admission led him to miss insulin  and have poor PO intake for one day. He was nauseous with vomiting and reported his blood sugar was >400 prior to coming to the hospital. He had abdominal pain that resolved at time of admission, increased thirst and urination, weakness, and chills. He was admitted for DKA and managed per DKA protocol with IVF, insulin infusion, and electrolyte correction as indicated. Phosphorous level was low at 1.4 and he received supplementation with phosphorous tablets. He was transitioned from insulin infusion and IVF to [facility] and novoLOG base + SSI with meals on HD1 and allowed to eat. He did experience some nausea without vomiting adequately managed with Zofran as needed. CBG were consistently elevated and insulin dose adjustments were made with signs of improved control of sugars.  Hx bipolar disorder Depression Patient admitted to history of bipolar disorder treated with an unknown medication and depression. He has a therapist established as an outpatient.   Hx asthma No signs of respiratory distress or adventitious lung sounds throughout this admission.

## 2021-11-07 NOTE — Plan of Care (Signed)
°  Problem: Education: Goal: Knowledge of General Education information will improve Description: Including pain rating scale, medication(s)/side effects and non-pharmacologic comfort measures Outcome: Progressing   Problem: Health Behavior/Discharge Planning: Goal: Ability to manage health-related needs will improve Outcome: Progressing   Problem: Clinical Measurements: Goal: Ability to maintain clinical measurements within normal limits will improve Outcome: Progressing Goal: Will remain free from infection Outcome: Progressing   Problem: Safety: Goal: Ability to remain free from injury will improve Outcome: Progressing   Problem: Education: Goal: Knowledge of General Education information will improve Description: Including pain rating scale, medication(s)/side effects and non-pharmacologic comfort measures Outcome: Progressing   Problem: Health Behavior/Discharge Planning: Goal: Ability to manage health-related needs will improve Outcome: Progressing   Problem: Clinical Measurements: Goal: Ability to maintain clinical measurements within normal limits will improve Outcome: Progressing Goal: Will remain free from infection Outcome: Progressing   Problem: Safety: Goal: Ability to remain free from injury will improve Outcome: Progressing

## 2021-11-08 DIAGNOSIS — E1010 Type 1 diabetes mellitus with ketoacidosis without coma: Principal | ICD-10-CM

## 2021-11-08 LAB — GLUCOSE, CAPILLARY
Glucose-Capillary: 72 mg/dL (ref 70–99)
Glucose-Capillary: 274 mg/dL — ABNORMAL HIGH (ref 70–99)
Glucose-Capillary: 258 mg/dL — ABNORMAL HIGH (ref 70–99)

## 2021-11-08 LAB — BASIC METABOLIC PANEL
Anion gap: 6 (ref 5–15)
Anion gap: 6 (ref 5–15)
Anion gap: 7 (ref 5–15)
BUN: 10 mg/dL (ref 6–20)
BUN: 5 mg/dL — ABNORMAL LOW (ref 6–20)
BUN: 9 mg/dL (ref 6–20)
CO2: 29 mmol/L (ref 22–32)
CO2: 30 mmol/L (ref 22–32)
CO2: 31 mmol/L (ref 22–32)
Calcium: 8.2 mg/dL — ABNORMAL LOW (ref 8.9–10.3)
Calcium: 8.3 mg/dL — ABNORMAL LOW (ref 8.9–10.3)
Calcium: 8.5 mg/dL — ABNORMAL LOW (ref 8.9–10.3)
Chloride: 100 mmol/L (ref 98–111)
Chloride: 100 mmol/L (ref 98–111)
Chloride: 101 mmol/L (ref 98–111)
Creatinine, Ser: 0.5 mg/dL — ABNORMAL LOW (ref 0.61–1.24)
Creatinine, Ser: 0.6 mg/dL — ABNORMAL LOW (ref 0.61–1.24)
Creatinine, Ser: 0.69 mg/dL (ref 0.61–1.24)
GFR, Estimated: >60 mL/min (ref >60)
GFR, Estimated: >60 mL/min (ref >60)
GFR, Estimated: >60 mL/min (ref >60)
Glucose, Bld: 217 mg/dL — ABNORMAL HIGH (ref 70–99)
Glucose, Bld: 247 mg/dL — ABNORMAL HIGH (ref 70–99)
Glucose, Bld: 382 mg/dL — ABNORMAL HIGH (ref 70–99)
Potassium: 3.2 mmol/L — ABNORMAL LOW (ref 3.5–5.1)
Potassium: 3.6 mmol/L (ref 3.5–5.1)
Potassium: 3.6 mmol/L (ref 3.5–5.1)
Sodium: 135 mmol/L (ref 135–145)
Sodium: 137 mmol/L (ref 135–145)
Sodium: 138 mmol/L (ref 135–145)

## 2021-11-08 LAB — HEMOGLOBIN A1C
Hgb A1c MFr Bld: >15.5 % — ABNORMAL HIGH (ref 4.8–5.6)
Mean Plasma Glucose: >398 mg/dL

## 2021-11-08 LAB — PHOSPHORUS: Phosphorus: 2.5 mg/dL (ref 2.5–4.6)

## 2021-11-08 MED ORDER — POTASSIUM CHLORIDE CRYS ER 20 MEQ PO TBCR
40.0000 meq | EXTENDED_RELEASE_TABLET | Freq: Once | ORAL | Status: AC
  Administered 2021-11-08: 40 meq via ORAL
  Filled 2021-11-08: qty 2

## 2021-11-08 MED ORDER — SODIUM CHLORIDE 0.9 % IV BOLUS
1000.0000 mL | Freq: Once | INTRAVENOUS | Status: AC
  Administered 2021-11-08: 1000 mL via INTRAVENOUS
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
## 2021-11-08 NOTE — Discharge Summary (Addendum)
Name: Keith [name] MRN: [id number] DOB: 09-17-2001 21 y.o. PCP: Sanger, Zollie Beckers, DO  Date of Admission: 11/06/2021 10:28 AM Date of Discharge:  11/08/2021 Attending Physician: Dr. Angelia Mould  DISCHARGE DIAGNOSIS:  Primary Problem: DKA (diabetic ketoacidosis) [facility])   Hospital Problems: Principal Problem:   DKA (diabetic ketoacidosis) ([place])  Hypokalemia  Hypophophatemia  DISCHARGE MEDICATIONS:   Allergies as of 11/08/2021       Reactions   Amoxicillin Hives, Rash   Penicillins Hives, Rash   Has patient had a PCN reaction causing immediate rash, facial/tongue/throat swelling, SOB or lightheadedness with hypotension: Yes Has patient had a PCN reaction causing severe rash involving mucus membranes or skin necrosis: Yes Has patient had a PCN reaction that required hospitalization: No Has patient had a PCN reaction occurring within the last 10 years: Yes If all of the above answers are "NO", then may proceed with Cephalosporin use.        Medication List     TAKE these medications    Accu-Chek FastClix Lancets Misc TEST SIX TIMES DAILY   Accu-Chek Guide test strip Generic drug: glucose blood TEST BLOOD SUGAR 8 TIMES DAILY   Accu-Chek Guide w/Device Kit 1 Units by Does not apply route daily.   Baqsimi Two Pack 3 MG/DOSE Powd Generic drug: Glucagon Place 1 application into the nose as needed. Use as directed if unconscious, unable to take food po, or having a seizure due to hypoglycemia   BD Pen Needle Nano U/F 32G X 4 MM Misc Generic drug: Insulin Pen Needle Use as directed   BD Pen Needle Nano 2nd Gen 32G X 4 MM Misc Generic drug: Insulin Pen Needle USE 1 NEEDLE 6 TIMES DAILY   cetirizine 10 MG tablet Commonly known as: ZYRTEC Take 10 mg by mouth daily.   ketoconazole 2 % cream Commonly known as: NIZORAL APPLY TO FEET TWICE DAILY FOR 1 MONTH THEN USE DAILY UNTIL FUNGAS CLEARS COMPLETELY   Lantus SoloStar 100 UNIT/ML Solostar Pen Generic drug:  insulin glargine Inject 45 Units into the skin daily.   NovoLOG FlexPen 100 UNIT/ML FlexPen Generic drug: insulin aspart Inject 8 Units into the skin 3 (three) times daily with meals. Add on additional # units per sliding scale instructions.   polyethylene glycol powder 17 GM/SCOOP powder Commonly known as: GLYCOLAX/MIRALAX Take 17 g by mouth as needed for constipation.   Urine Glucose-Ketones Test Strp 1 each by Other route as needed.        DISPOSITION AND FOLLOW-UP:  Keith [name] was discharged from [facility] in Stable condition. At the hospital follow up visit please address:  Type 1 diabetes mellitus DKA HbA1c pending at time of discharge and will need to be followed up.  No changes were made to outpatient regimen at time of discharge however I recommend further assessment and education on patient's current insulin needs.  He would benefit from a replacement insulin pump and CGM monitor.  Hx bipolar disorder Depression Patient reported taking a medication for bipolar disorder however we were unable to see that a prescription has been filled from an external pharmacy.  Please follow-up on what medication he is taking.  Follow-up Recommendations: Consults: Endocrinology Labs: Basic Metabolic Profile and Hgb W0J Studies: None Medications: No adjustments were made at time of discharge. I recommend close follow-up with endocrinology to assess what regimen changes need to be made. I also recommend inquiring about a replacement insulin pump and CGM monitor.   Follow-up Appointments:  Follow-up  Information     Sanger, Zollie Beckers, DO Follow up.   Specialty: Pediatrics Why: As needed Contact information: [place] [address] [phone]         Sherrlyn Hock, MD Follow up.   Specialty: Pediatrics Contact information: [address] [place] [address] [place]-  Diabetes Support Follow up.   Why: Diabetes Support Group every 3rd Monday of the month. The next Support Group will be December 05, 2021 at [name] information: [address] [place], [place] [address] [place]:  Patient Summary: Type 1 diabetes mellitus DKA Patient presented after a binge drinking episode two days prior to admission led him to miss insulin  and have poor PO intake for one day. He was nauseous with vomiting and reported his blood sugar was >400 prior to coming to the hospital. He had abdominal pain that resolved at time of admission, increased thirst and urination, weakness, and chills. He was admitted for DKA and managed per DKA protocol with IVF, insulin infusion, and electrolyte correction as indicated. Phosphorous level was low at 1.4 and he received supplementation with phosphorous tablets. He was transitioned from insulin infusion and IVF to [facility] and novoLOG base + SSI with meals on HD1 and allowed to eat. He did experience some nausea without vomiting adequately managed with Zofran as needed. CBG were consistently elevated and insulin dose adjustments were made with signs of improved control of sugars.  Hx bipolar disorder Depression Patient admitted to history of bipolar disorder treated with an unknown medication and depression. He has a therapist established as an outpatient.   Hx asthma No signs of respiratory distress or adventitious lung sounds throughout this admission.   DISCHARGE INSTRUCTIONS:   Discharge Instructions     Diet - low sodium heart healthy   Complete by: As directed    Discharge instructions   Complete by: As directed    Keith [name],  It was a pleasure caring for you during your stay at [facility].  I am glad that you are feeling much better and that your blood sugars have come down drastically from when you first came in.  As we discussed, I recommend that you check your blood sugar  before eating and based your mealtime insulin on this reading.  Give yourself the insulin injection and wait 5 to 10 minutes before eating.  If you feel like your blood sugar is getting low, check to see what the reading is and eat a light snack as your blood sugars are sensitive to meals.  I will not change your outpatient regimen and recommend that you follow-up with your endocrinologist within the next week or so.  My best, Dr. Marlou Sa   Increase activity slowly   Complete by: As directed        SUBJECTIVE:  Patient evaluated at bedside on day of discharge.  He reports feeling much better compared to when he was admitted to the hospital.  He has not had recurrence of nausea or vomiting and is tolerating p.o. intake well.  He asks that we contact his grandfather regarding follow-up endocrinology appointment scheduling as he takes the patient to these appointments. Discharge Vitals:   BP 93/62 (BP Location: Right Arm)    Pulse 97    Temp 98.1 F (36.7 C) (Oral)  Resp 18    Ht '5\' 5"'  (1.651 m)    Wt 48.7 kg    SpO2 97%    BMI 17.87 kg/m   OBJECTIVE:  Constitutional: Pleasant gentleman resting comfortably in bed.  No acute distress noted. Cardio: Regular rate and rhythm.  No murmurs, rubs, gallops. Pulm: Clear to auscultation bilaterally.  No work of breathing room air. Abdomen: Soft, nontender, nondistended. MSK: Negative for extremity edema. Skin: Warm and dry. Neuro: Alert and oriented x3.  No focal deficit noted. Psych: Normal mood and affect.  Pertinent Labs, Studies, and Procedures:  CBC Latest Ref Rng & Units 11/06/2021 11/06/2021 11/06/2021  WBC 4.0 - 10.5 K/uL - - 10.9(H)  Hemoglobin 13.0 - 17.0 g/dL 16.7 19.4(H) 17.4(H)  Hematocrit 39.0 - 52.0 % 49.0 57.0(H) 56.7(H)  Platelets 150 - 400 K/uL - - 293    CMP Latest Ref Rng & Units 11/08/2021 11/08/2021 11/07/2021  Glucose 70 - 99 mg/dL 247(H) 217(H) 382(H)  BUN 6 - 20 mg/dL 5(L) 9 10  Creatinine 0.61 - 1.24 mg/dL 0.50(L) 0.60(L)  0.69  Sodium 135 - 145 mmol/L 138 137 135  Potassium 3.5 - 5.1 mmol/L 3.6 3.2(L) 3.6  Chloride 98 - 111 mmol/L 101 100 100  CO2 22 - 32 mmol/L '30 31 29  ' Calcium 8.9 - 10.3 mg/dL 8.2(L) 8.5(L) 8.3(L)  Total Protein 6.5 - 8.1 g/dL - - -  Total Bilirubin 0.3 - 1.2 mg/dL - - -  Alkaline Phos 38 - 126 U/L - - -  AST 15 - 41 U/L - - -  ALT 0 - 44 U/L - - -    CT ABDOMEN PELVIS W CONTRAST  Result Date: 11/06/2021 CLINICAL DATA:  21 year old male with acute RIGHT abdominal and pelvic pain with nausea and vomiting for 2 days. EXAM: CT ABDOMEN AND PELVIS WITH CONTRAST TECHNIQUE: Multidetector CT imaging of the abdomen and pelvis was performed using the standard protocol following bolus administration of intravenous contrast. RADIATION DOSE REDUCTION: This exam was performed according to the departmental dose-optimization program which includes automated exposure control, adjustment of the mA and/or kV according to patient size and/or use of iterative reconstruction technique. CONTRAST:  71m OMNIPAQUE IOHEXOL 300 MG/ML  SOLN COMPARISON:  06/05/2017 CT FINDINGS: Lower chest: Unremarkable Hepatobiliary: The liver and gallbladder are unremarkable. There is no evidence of intrahepatic or extrahepatic biliary dilatation. Pancreas: Unremarkable Spleen: Unremarkable Adrenals/Urinary Tract: The kidneys, adrenal glands and bladder are unremarkable. Stomach/Bowel: Stomach is within normal limits. The appendix is not identified due to paucity of fat, but there are no findings to suggest appendicitis. No evidence of bowel wall thickening, distention, or inflammatory changes. Vascular/Lymphatic: No significant vascular findings are present. No enlarged abdominal or pelvic lymph nodes. Reproductive: Prostate is unremarkable. Other: No ascites, focal collection or pneumoperitoneum. Musculoskeletal: No acute or suspicious bony abnormalities are noted. IMPRESSION: No evidence of acute or significant abnormality. The appendix is  not identified but there are no findings to suggest appendicitis. Electronically Signed   By: JMargarette CanadaM.D.   On: 11/06/2021 13:22     Signed: EFarrel Gordon D.O.  Internal Medicine Resident, PGY-1 [facility]  Pager: #[phone] [id number]:46 AM, 11/08/2021

## 2021-11-08 NOTE — Progress Notes (Signed)
Patient do not have Guardian.

## 2021-11-22 ENCOUNTER — Other Ambulatory Visit (INDEPENDENT_AMBULATORY_CARE_PROVIDER_SITE_OTHER): Payer: Self-pay | Admitting: "Endocrinology
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
## 2022-01-16 ENCOUNTER — Ambulatory Visit (INDEPENDENT_AMBULATORY_CARE_PROVIDER_SITE_OTHER): Payer: Medicaid Other | Admitting: "Endocrinology

## 2022-01-16 ENCOUNTER — Encounter (INDEPENDENT_AMBULATORY_CARE_PROVIDER_SITE_OTHER): Payer: Self-pay | Admitting: "Endocrinology

## 2022-01-16 VITALS — BP 122/78 | HR 104 | Ht 63.58 in | Wt 108.0 lb

## 2022-01-16 DIAGNOSIS — I4711 Inappropriate sinus tachycardia, so stated: Secondary | ICD-10-CM

## 2022-01-16 DIAGNOSIS — K3184 Gastroparesis: Secondary | ICD-10-CM

## 2022-01-16 DIAGNOSIS — E049 Nontoxic goiter, unspecified: Secondary | ICD-10-CM

## 2022-01-16 DIAGNOSIS — E1043 Type 1 diabetes mellitus with diabetic autonomic (poly)neuropathy: Secondary | ICD-10-CM

## 2022-01-16 DIAGNOSIS — E1143 Type 2 diabetes mellitus with diabetic autonomic (poly)neuropathy: Secondary | ICD-10-CM

## 2022-01-16 DIAGNOSIS — R140 Abdominal distension (gaseous): Secondary | ICD-10-CM

## 2022-01-16 DIAGNOSIS — E1065 Type 1 diabetes mellitus with hyperglycemia: Secondary | ICD-10-CM | POA: Diagnosis not present

## 2022-01-16 DIAGNOSIS — E10649 Type 1 diabetes mellitus with hypoglycemia without coma: Secondary | ICD-10-CM

## 2022-01-16 DIAGNOSIS — E063 Autoimmune thyroiditis: Secondary | ICD-10-CM

## 2022-01-16 DIAGNOSIS — E1042 Type 1 diabetes mellitus with diabetic polyneuropathy: Secondary | ICD-10-CM | POA: Diagnosis not present

## 2022-01-16 DIAGNOSIS — R634 Abnormal weight loss: Secondary | ICD-10-CM

## 2022-01-16 DIAGNOSIS — R110 Nausea: Secondary | ICD-10-CM | POA: Insufficient documentation

## 2022-01-16 DIAGNOSIS — G609 Hereditary and idiopathic neuropathy, unspecified: Secondary | ICD-10-CM

## 2022-01-16 DIAGNOSIS — R000 Tachycardia, unspecified: Secondary | ICD-10-CM

## 2022-01-16 LAB — POCT GLUCOSE (DEVICE FOR HOME USE): POC Glucose: 296 mg/dl — AB (ref 70–99)

## 2022-01-16 MED ORDER — OMEPRAZOLE 20 MG PO CPDR: DELAYED_RELEASE_CAPSULE | ORAL | 5 refills | Status: DC

## 2022-01-16 NOTE — Patient Instructions (Signed)
Follow up visit in two months.   At [facility], we are committed to providing exceptional care. You will receive a patient satisfaction survey through text or email regarding your visit today. Your opinion is important to me. Comments are appreciated.  

## 2022-01-17 LAB — MICROALBUMIN / CREATININE URINE RATIO
Creatinine, Urine: 43 mg/dL (ref 20–320)
Microalb Creat Ratio: 19 mcg/mg creat (ref <30)
Microalb, Ur: 0.8 mg/dL

## 2022-01-17 LAB — COMPREHENSIVE METABOLIC PANEL
AG Ratio: 2.1 (calc) (ref 1.0–2.5)
ALT: 11 U/L (ref 8–46)
AST: 10 U/L — ABNORMAL LOW (ref 12–32)
Albumin: 4.8 g/dL (ref 3.6–5.1)
Alkaline phosphatase (APISO): 90 U/L (ref 46–169)
BUN: 11 mg/dL (ref 7–20)
CO2: 23 mmol/L (ref 20–32)
Calcium: 10 mg/dL (ref 8.9–10.4)
Chloride: 100 mmol/L (ref 98–110)
Creat: 0.71 mg/dL (ref 0.60–1.24)
Globulin: 2.3 g/dL (calc) (ref 2.1–3.5)
Glucose, Bld: 272 mg/dL — ABNORMAL HIGH (ref 65–99)
Potassium: 4.4 mmol/L (ref 3.8–5.1)
Sodium: 137 mmol/L (ref 135–146)
Total Bilirubin: 0.8 mg/dL (ref 0.2–1.1)
Total Protein: 7.1 g/dL (ref 6.3–8.2)

## 2022-01-17 LAB — TSH: TSH: 0.95 mIU/L (ref 0.50–4.30)

## 2022-01-17 LAB — LIPID PANEL
Cholesterol: 207 mg/dL — ABNORMAL HIGH (ref <170)
HDL: 72 mg/dL (ref >45)
LDL Cholesterol (Calc): 121 mg/dL (calc) — ABNORMAL HIGH (ref <110)
Non-HDL Cholesterol (Calc): 135 mg/dL (calc) — ABNORMAL HIGH (ref <120)
Total CHOL/HDL Ratio: 2.9 (calc) (ref <5.0)
Triglycerides: 57 mg/dL (ref <90)

## 2022-01-17 LAB — HEMOGLOBIN A1C
Hgb A1c MFr Bld: >14 % of total Hgb — ABNORMAL HIGH (ref <5.7)
Mean Plasma Glucose: 355 mg/dL
eAG (mmol/L): 19.7 mmol/L

## 2022-01-17 LAB — T3, FREE: T3, Free: 3.4 pg/mL (ref 3.0–4.7)

## 2022-01-17 LAB — T4, FREE: Free T4: 1.3 ng/dL (ref 0.8–1.4)

## 2022-04-13 NOTE — Progress Notes (Signed)
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 free T4 1.3, free T3 3.4; CMP normal, except glucose 272; cholesterol 207, triglycerides 57, HDL 72, LDL 121; urinary microalbumin/creatinine ratio 19  Labs 11/06/21: Serum CO2 8, BHOB 6.7 (ref 0.05-9.270, venous pH 7.078.  Labs 10/04/21: HbA1c >14%, CBG 296  Labs 04/29/21: HbA1c >15.5%; BHOB 2.26 (ref 0.05-0.27), venous pH 7.312, serum potassium 2.9, serum osmolality 294 (ref 275-295);  U/A urine glucose >500, urine ketones 80  Labs 11/30/20: HbA1c >14%, CBG 600, dipstick positive for glucose and large ketones; TSH 1.27, free T4 1.3, free T3 3.0; CMP normal, except glucose 285, total protein 5.8 (ref 6.3-8.2), and globulin 1.7 (ref 2.1-3.5); CBC normal; iron 66 (ref 27-164); urinary microalbumin/creatinine ratio too low to measure  Labs 11/28/20: CMP normal, except glucose 309, potassium 3.4; CBC normal; U/A >500 glucose, 80 ketones  Labs 02/02/20: HbA1c >14%, CBG 336; Urine ketones are large.  Labs 11/13/19: HbA1c 12.2%, CBG 446; TSH 1.14, free T4 1.2, free T3 2.9; CMP normal, except glucose 359 and slightly low globulins; cholesterol 183, triglycerides 90, HDL 62, LDL 121; microalbumin/creatinine ratio too low to measure; urinalysis with large ketones;    Labs 09/04/18: CBG 54  Labs 07/25/18: HbA1c >14%.CBG 192  Labs 11/13/17: HbA1c 9.4%, CBG 84; TSH 1.23, free T4 1.1, free T3 3.6; C-peptide <0.10;   Labs 07/03/17: HbA1c >14%. CBG is 389. Urine ketones were large. At 11:30 AM the BG was 284. He took a correction dose of Novolog.   Labs 06/05/17: CBG 142  Labs 05/16/17: HbA1c 12.3%; glucose 370, CO2 27, BHOB 0.56 (ref 0.05-0.27); venous pH 7.362  Labs 02/15/17: HbA1c 9.5%, CBG 307  Labs 11/29/16: HbA1c 9.7%, CBG 399  Labs 09/26/16: CBG 235  Labs 07/21/16: TSH 1.21, free T4 1.1, free T3 4.0;  C-peptide 0.91 (ref 0.80-3.85), anti-GAD antibody <5, anti-ICA <5, anti-insulin antibodies 4.6 (ref <5); CMP normal except for  glucose 67  Labs 07/20/16: HbA1c 6.2%, CBG 99  Labs 03/24/16: HbA1c 6.2%  IMAGING:  Bone age 69/[birth year]19: Bone age was read as 30 years at a chronologic age of 87 years and 11 months. The BA was normal.    Assessment and Plan:  Assessment  ASSESSMENT:  1. Uncontrolled T1DM:   A. Shmuel's BGs are still too high due to his own noncompliance. He appears to frequently be missing both his Lantus and his Novolog insulins.    B. He says the wants to have a new T-Slim pump and Dexcom.  2. Hypoglycemia: He says he is not having any low BG symptoms, but did have a 68 recently in the midday.  3. Dehydration: He appears to be well hydrated today. somewhat dehydrated today,  4-5. Goiter/thyroiditis: His thyroid gland is enlarged today, but the lobes have shifted in size yet again. His thyroiditis is clinically quiescent. He was euthyroid in February 2021. It was likely that he would follow in the footsteps of his maternal grandmother and become hypothyroid in the future. We need to repeat his TFTs. 6-7. Growth delay, physical/unintentional weight loss: His height growth has plateaued. His weight has decreased since his last visit. It appears that his weight loss is due to severe underinsulinization.  8. Tinea pedis: His tinea is still active. He needs to use the ketoconazole every day.   9-12. Adjustment reaction/hallucinations/deliberate self-cutting/PTSD/bipolar disorder:   A. These problems had improved when he was taking psych meds and undergoing counseling.  B. Unfortunately, these problems worsened after he stopped taking those medications.  I suggested at his last visit with me that the family contact Dr. Migdalia Dk and Dr. Hetty Ely for their recommendations and referral to a local psychiatrist. Those actions did not happen.  C. It is important that Keith [name] be evaluated by a  psychiatrist and started back on psych medications.  13. Peripheral neuropathy: He has no evidence for peripheral neuropathy today. 14. Autonomic neuropathy: He still has inappropriate sinus tachycardia. He also has bloating and constipation, c/w gastroparesis and colonoparesis.  15. Nausea: He may have had more retention of gastric acid due to gastroparesis, but says that he is doing better now . Omeprazole may help if he will take it.   Nail bed pallor: We need to check his CBC and iron.   PLAN:  1. Diagnostic: HbA1c, TFTs, CMP, lipid panel, microalbumin/creatinine ratio. Bring in his BG.  2. Therapeutic: Take 50 units of Lantus and follow his Novolog 120/30/12 plan. I will request that our diabetes educator get him started back on a Dexcom and T-Slim pump. Start omeprazole, 20 mg, twice daily.  3. Patient education: We discussed all of the above, to include the need to take better control of his BG.  4. Follow-up: in [place]  Level of Service: This visit lasted in excess of 75 minutes. More than 50% of the visit was devoted to counseling.   Tillman Sers, MD, CDE Pediatric and Adult Endocrinology

## 2022-04-14 ENCOUNTER — Encounter (INDEPENDENT_AMBULATORY_CARE_PROVIDER_SITE_OTHER): Payer: Self-pay | Admitting: "Endocrinology

## 2022-04-14 ENCOUNTER — Ambulatory Visit (INDEPENDENT_AMBULATORY_CARE_PROVIDER_SITE_OTHER): Payer: Medicaid Other | Admitting: "Endocrinology

## 2022-04-14 VITALS — BP 118/80 | HR 136 | Wt 104.8 lb

## 2022-04-14 DIAGNOSIS — R110 Nausea: Secondary | ICD-10-CM

## 2022-04-14 DIAGNOSIS — E860 Dehydration: Secondary | ICD-10-CM

## 2022-04-14 DIAGNOSIS — F4320 Adjustment disorder, unspecified: Secondary | ICD-10-CM

## 2022-04-14 DIAGNOSIS — E049 Nontoxic goiter, unspecified: Secondary | ICD-10-CM

## 2022-04-14 DIAGNOSIS — E10649 Type 1 diabetes mellitus with hypoglycemia without coma: Secondary | ICD-10-CM | POA: Diagnosis not present

## 2022-04-14 DIAGNOSIS — R000 Tachycardia, unspecified: Secondary | ICD-10-CM

## 2022-04-14 DIAGNOSIS — E1043 Type 1 diabetes mellitus with diabetic autonomic (poly)neuropathy: Secondary | ICD-10-CM

## 2022-04-14 DIAGNOSIS — F4310 Post-traumatic stress disorder, unspecified: Secondary | ICD-10-CM

## 2022-04-14 DIAGNOSIS — E1065 Type 1 diabetes mellitus with hyperglycemia: Secondary | ICD-10-CM | POA: Diagnosis not present

## 2022-04-14 DIAGNOSIS — R231 Pallor: Secondary | ICD-10-CM

## 2022-04-14 DIAGNOSIS — E1042 Type 1 diabetes mellitus with diabetic polyneuropathy: Secondary | ICD-10-CM

## 2022-04-14 DIAGNOSIS — E063 Autoimmune thyroiditis: Secondary | ICD-10-CM

## 2022-04-14 DIAGNOSIS — B353 Tinea pedis: Secondary | ICD-10-CM

## 2022-04-14 DIAGNOSIS — F3112 Bipolar disorder, current episode manic without psychotic features, moderate: Secondary | ICD-10-CM

## 2022-04-14 DIAGNOSIS — E7800 Pure hypercholesterolemia, unspecified: Secondary | ICD-10-CM

## 2022-04-14 LAB — POCT GLUCOSE (DEVICE FOR HOME USE): POC Glucose: 391 mg/dl — AB (ref 70–99)

## 2022-04-14 LAB — POCT GLYCOSYLATED HEMOGLOBIN (HGB A1C): HbA1c POC (<> result, manual entry): >14 % (ref 4.0–5.6)

## 2022-04-14 NOTE — Patient Instructions (Addendum)
No further appointments here. Please bring in BG meter soon for download.

## 2022-04-15 LAB — CBC WITH DIFFERENTIAL/PLATELET
Absolute Monocytes: 413 cells/uL (ref 200–950)
Basophils Absolute: 61 cells/uL (ref 0–200)
Basophils Relative: 1.1 %
Eosinophils Absolute: 22 cells/uL (ref 15–500)
Eosinophils Relative: 0.4 %
HCT: 45.2 % (ref 38.5–50.0)
Hemoglobin: 14.5 g/dL (ref 13.2–17.1)
Lymphs Abs: 1870 cells/uL (ref 850–3900)
MCH: 26.6 pg — ABNORMAL LOW (ref 27.0–33.0)
MCHC: 32.1 g/dL (ref 32.0–36.0)
MCV: 82.8 fL (ref 80.0–100.0)
MPV: 10.1 fL (ref 7.5–12.5)
Monocytes Relative: 7.5 %
Neutro Abs: 3135 cells/uL (ref 1500–7800)
Neutrophils Relative %: 57 %
Platelets: 344 10*3/uL (ref 140–400)
RBC: 5.46 10*6/uL (ref 4.20–5.80)
RDW: 12.7 % (ref 11.0–15.0)
Total Lymphocyte: 34 %
WBC: 5.5 10*3/uL (ref 3.8–10.8)

## 2022-04-15 LAB — IRON: Iron: 105 ug/dL (ref 27–164)

## 2022-04-18 ENCOUNTER — Encounter (INDEPENDENT_AMBULATORY_CARE_PROVIDER_SITE_OTHER): Payer: Self-pay | Admitting: Pharmacist

## 2022-04-19 ENCOUNTER — Encounter (INDEPENDENT_AMBULATORY_CARE_PROVIDER_SITE_OTHER): Payer: Self-pay

## 2022-04-22 ENCOUNTER — Other Ambulatory Visit (INDEPENDENT_AMBULATORY_CARE_PROVIDER_SITE_OTHER): Payer: Self-pay | Admitting: "Endocrinology

## 2022-04-24 ENCOUNTER — Encounter (INDEPENDENT_AMBULATORY_CARE_PROVIDER_SITE_OTHER): Payer: Self-pay

## 2022-04-27 ENCOUNTER — Ambulatory Visit (INDEPENDENT_AMBULATORY_CARE_PROVIDER_SITE_OTHER): Payer: Medicaid Other | Admitting: Pharmacist

## 2022-04-27 DIAGNOSIS — E1065 Type 1 diabetes mellitus with hyperglycemia: Secondary | ICD-10-CM

## 2022-04-27 NOTE — Progress Notes (Addendum)
   S:     Chief Complaint  Patient presents with   Diabetes    Prepump    Endocrinology provider: Dr. Fransico Michael (upcoming appt 06/15/22 11:15 am)  Patient has decided to initiate process to start a hybrid closed loop insulin pump, PMH significant for T1DM (dx 08/03/15), goiter, peripheral neuropathy, hx of self harm (self-cutting), noncompliance with diabetes treatment.   Patient presents today with his grandfather.  Insurance Coverage: Traditional [place] Medicaid ([id number] P)  Preferred Pharmacy [facility] STORE [phone] Rosalita Levan, [place] - [address] AT [facility] [address]  [address] [place], [place] [address]  Phone:  [phone]  Fax:  [phone]  DEA #:  [id number]  DAW Reason: --   Medication Adherence -Patient reports adherence with medications.  -Current diabetes medications include: Lantus 50 units daily, Novolog ICR 1:12, ISF 1:30, target BG 120 -Forgets Lantus 3x each week -Does not forget Novolog -Prior diabetes medications include: none   Pre-pump Topics Insulin Pump Basics Sick Day Management Pump Failure Travel  Pump Start Instructions   Prepump Survey Responses Email address: [email or web address], [email or web address] Long acting insulin, dose, time administered: Lantus 50 units daily around 10 pm Rapid acting insulin: Novolog ICR 1:12, ISF 1:30, target BG 120 Breakfast time (10-11 am): 10-15 units Lunch time (4 hours after): ~6 units Dinner time (4 hours): ~8 units  Labs:    There were no vitals filed for this visit.  HbA1c Lab Results  Component Value Date   HGBA1C >14 04/14/2022   HGBA1C >14.0 (H) 01/16/2022   HGBA1C >15.5 (H) 11/07/2021    Pancreatic Islet Cell Autoantibodies Lab Results  Component Value Date   ISLETAB <5 07/21/2016    Insulin Autoantibodies Lab Results  Component Value Date   INSULINAB 4.6 (H) 07/21/2016    Glutamic Acid Decarboxylase Autoantibodies Lab Results   Component Value Date   GLUTAMICACAB <5 07/21/2016    ZnT8 Autoantibodies No results found for: "ZNT8AB"  IA-2 Autoantibodies No results found for: "LABIA2"  C-Peptide Lab Results  Component Value Date   CPEPTIDE <0.10 (L) 11/13/2017    Microalbumin Lab Results  Component Value Date   MICRALBCREAT 19 01/16/2022    Lipids    Component Value Date/Time   CHOL 207 (H) 01/16/2022 1431   TRIG 57 01/16/2022 1431   HDL 72 01/16/2022 1431   CHOLHDL 2.9 01/16/2022 1431   LDLCALC 121 (H) 01/16/2022 1431    Assessment and Plan: Education - Thoroughly discussed all pre-pump topics (insulin pump basics, sick day management, pump failure, travel, and pump start instructions). We will plan first to start Dexcom G6 CGM. At follow up Dexcom G6 CGM training family will notify me which hybrid closed loop insulin pump they would like to start.   Emailed patient instructions to [email or web address] and [email or web address]  This appointment required 60 minutes of patient care (this includes precharting, chart review, review of results, face-to-face care, etc.).  Thank you for involving clinical pharmacist/diabetes educator to assist in providing this patient's care.  Zachery Conch, PharmD, BCACP, CDCES, CPP  I have reviewed the following documentation and am in agreeance with the plan. I was immediately available to the clinical pharmacist for questions and collaboration.  Molli Knock, MD CDCES

## 2022-04-28 ENCOUNTER — Encounter (INDEPENDENT_AMBULATORY_CARE_PROVIDER_SITE_OTHER): Payer: Self-pay | Admitting: Pharmacist

## 2022-04-28 ENCOUNTER — Telehealth (INDEPENDENT_AMBULATORY_CARE_PROVIDER_SITE_OTHER): Payer: Self-pay | Admitting: Pharmacist

## 2022-04-28 DIAGNOSIS — E1065 Type 1 diabetes mellitus with hyperglycemia: Secondary | ICD-10-CM

## 2022-04-28 MED ORDER — DEXCOM G6 RECEIVER DEVI: 1 refills | Status: DC

## 2022-04-28 MED ORDER — DEXCOM G6 SENSOR MISC: 1.0000 | 5 refills | Status: DC

## 2022-04-28 MED ORDER — DEXCOM G6 TRANSMITTER MISC: 1.0000 | 3 refills | Status: DC

## 2022-04-28 NOTE — Telephone Encounter (Signed)
Submitted Dexcom G6 CGM prior authorizations on [employer]CT[employer]  Dexcom G6 Sensor Confirmation #:[id number] W Prior Approval #:[id number] Status:APPROVED  Dexcom G6 Transmitter Confirmation [id number] W Prior Approval #:[id number] Status:APPROVED  Dexcom G6 Receiver Confirmation #:[id number] W Prior Approval #:[id number] Status:APPROVED  Sent in prescriptions to preferred local pharmacy.  [facility] #[id number] Rosalita Levan, [place] - [address] AT [facility] [address]  [address] [place], [place] [address]  Phone:  [phone]  Fax:  [phone]  DEA #:  [id number]  DAW Reason: --    Called patient on 04/28/2022 at 8:55 AM and left HIPAA-compliant VM with instructions to call [facility] back. Will send patient a Mychart message.  Thank you for involving clinical pharmacist/diabetes educator to assist in providing this patient's care.   Zachery Conch, PharmD, BCACP, CDCES, CPP

## 2022-05-03 ENCOUNTER — Encounter (INDEPENDENT_AMBULATORY_CARE_PROVIDER_SITE_OTHER): Payer: Self-pay | Admitting: Pharmacist

## 2022-05-03 ENCOUNTER — Ambulatory Visit (INDEPENDENT_AMBULATORY_CARE_PROVIDER_SITE_OTHER): Payer: Medicaid Other | Admitting: Pharmacist

## 2022-05-03 VITALS — Wt 110.6 lb

## 2022-05-03 DIAGNOSIS — E1065 Type 1 diabetes mellitus with hyperglycemia: Secondary | ICD-10-CM | POA: Diagnosis not present

## 2022-05-03 LAB — POCT GLUCOSE (DEVICE FOR HOME USE): POC Glucose: 278 mg/dl — AB (ref 70–99)

## 2022-05-03 NOTE — Progress Notes (Signed)
   S:     Chief Complaint  Patient presents with   Diabetes    Endocrinology provider: Dr. Fransico Michael (upcoming appt 06/15/22 11:15 am)  Patient has decided to initiate process to start a hybrid closed loop insulin pump, PMH significant for T1DM (dx 08/03/15), goiter, peripheral neuropathy, hx of self harm (self-cutting), noncompliance with diabetes treatment.   Patient presents today with his grandfather. They report that they have not obtained the Dexcom G6 CGM prescriptions from the pharmacy as they were not notified from pharmacy to pick up prescriptions. He would like to start the bionic pancreas insulin pump.   Insurance Coverage: [place] Traditional Medicaid  Preferred Pharmacy: [facility] #[id number] Rosalita Levan, [place] - [address] AT [facility] [address]  [address] [place], [place] [address]  Phone:  [phone]  Fax:  [phone]  DEA #:  [id number]  DAW Reason: --     O:   Labs:    There were no vitals filed for this visit.  HbA1c Lab Results  Component Value Date   HGBA1C >14 04/14/2022   HGBA1C >14.0 (H) 01/16/2022   HGBA1C >15.5 (H) 11/07/2021    Pancreatic Islet Cell Autoantibodies Lab Results  Component Value Date   ISLETAB <5 07/21/2016    Insulin Autoantibodies Lab Results  Component Value Date   INSULINAB 4.6 (H) 07/21/2016    Glutamic Acid Decarboxylase Autoantibodies Lab Results  Component Value Date   GLUTAMICACAB <5 07/21/2016    ZnT8 Autoantibodies No results found for: "ZNT8AB"  IA-2 Autoantibodies No results found for: "LABIA2"  C-Peptide Lab Results  Component Value Date   CPEPTIDE <0.10 (L) 11/13/2017    Microalbumin Lab Results  Component Value Date   MICRALBCREAT 19 01/16/2022    Lipids    Component Value Date/Time   CHOL 207 (H) 01/16/2022 1431   TRIG 57 01/16/2022 1431   HDL 72 01/16/2022 1431   CHOLHDL 2.9 01/16/2022 1431   LDLCALC 121 (H) 01/16/2022 1431    Assessment  and Plan: Contacted pharmacy. All Dexcom G6 CGM prescriptions are ready to be picked up. Advised family to pick up prescriptions then Mychart me Kahleb's schedule so we can set up virtual Dexcom G6 CGM training. Will initiate process to start bionic pancreas insulin pump.  This appointment required 10 minutes of patient care (this includes precharting, chart review, review of results, face-to-face care, etc.).  Thank you for involving clinical pharmacist/diabetes educator to assist in providing this patient's care.  Zachery Conch, PharmD, BCACP, CDCES, CPP

## 2022-05-10 ENCOUNTER — Encounter (INDEPENDENT_AMBULATORY_CARE_PROVIDER_SITE_OTHER): Payer: Self-pay | Admitting: Pharmacist

## 2022-05-11 ENCOUNTER — Encounter (INDEPENDENT_AMBULATORY_CARE_PROVIDER_SITE_OTHER): Payer: Self-pay | Admitting: Pharmacist

## 2022-05-11 ENCOUNTER — Ambulatory Visit (INDEPENDENT_AMBULATORY_CARE_PROVIDER_SITE_OTHER): Payer: Medicaid Other | Admitting: Pharmacist

## 2022-05-11 DIAGNOSIS — E1065 Type 1 diabetes mellitus with hyperglycemia: Secondary | ICD-10-CM | POA: Diagnosis not present

## 2022-05-11 NOTE — Patient Instructions (Signed)
It was a pleasure seeing you in clinic today!  How to change Dexcom G6 sensor: [email or web address]   How to change Dexcom G6 transmitter: [email or web address]   Please call the pediatric endocrinology clinic at  [phone] if you have any questions.   Please remember... 1. Sensor will last 10 days 2. Transmitter will last 90 days and must be reused 3. Sensor should be applied to area away from waistband, scarring, tattoos, irritation, and bones. 4. Transmitter must be within 20 feet of receiver/cell phone. 5. If using Dexcom G6 app on cell phone, please remember to keep app open (do not close out of app). 6. Do a fingerstick blood glucose test if the sensor readings do not match how    you feel 7. Remove sensor prior to magnetic resonance imaging (MRI), computed tomography (CT) scan, or high-frequency electrical heat (diathermy) treatment. 8. Do not allow sun screen or insect repellant to come into contact with Dexcom G6. These skin care products may lead for the plastic used in the Dexcom G6 to crack. 9. Dexcom G6 may be worn through a Environmental education officer. It may not be exposed to an advanced Imaging Technology (AIT) body scanner (also called a millimeter wave scanner) or the baggage x-ray machine. Instead, ask for hand-wanding or full-body pat-down and visual inspection.  10. Doses of acetaminophen (Tylenol) >1 gram every 6 hours may cause false high readings. 11. Hydroxyurea (Hydrea, Droxia) may interfere with accuracy of blood glucose readings from Dexcom G6. 12. Store sensor kit between 36 and 86 degrees Farenheit.    Ordering Overlay Patches 1. Receiver: Go to the following website every 30 days to order new overlay patches:  [email or web address] 2. Cellphone (Dexcom G6 app): main screen --> settings  --> scroll down to contact --> request sensor overpatches   Problems with Dexcom sticking? 1.  Order Skin Tac from [facility] Alcohol swab area you plan to administer Dexcom then let dry. Once dry, apply Skin Tac in a circular motion (with a spot in the middle for sensor without skin tac) and let dry. Once dry you can apply Dexcom!   Problems taking off Dexcom? 1. Remember to try to shower/bathe before removing Dexcom 2. Order Tac Away to help remove any extra adhesive left on your skin once you remove [facility]   Dexcom Customer Service Information Customer Sales Support (dexcom orders and general customer questions) Phone number: [phone] Monday - Friday  6 AM - 5 PM PST Saturday 8 AM - 4 PM PST  *Contact if you do not receive overlay patches   2. Global Technical Support (product troubleshooting or replacement inquiries) Phone number: [phone] Available 24 hours a day; 7 days a week  *Contact if you have a "bad" sensor. Remember to tell them you are wearing Dexcom on your stomach!   3. Dexcom Care (provides dexcom CGM training, software downloads, and tutorials) Phone number: [phone] Monday - Friday 6 AM - 5 PM PST Saturday 7 AM - 1:30 PM PST (All hours subject to change)   4. Website: [email or web address]

## 2022-05-11 NOTE — Progress Notes (Signed)
S:     Chief Complaint  Patient presents with   Diabetes    Dexcom G6 CGM Training    Endocrinology provider: Dr. [name] (upcoming appt 06/15/22 11:15 am)  Patient referred by Dr. [name] for initiation and training of Dexcom G6 CGM. PMH significant for T1DM (dx 08/03/15), goiter, peripheral neuropathy, hx of self harm (self-cutting), noncompliance with diabetes treatment.   Patient presents today with his grandfather. They report that they have obtained the Dexcom G6 CGM prescriptions from the pharmacy.  Insurance Coverage: [place] Traditional Medicaid  Preferred Pharmacy: [facility] [place], [place] - [address]  [place], [place] [address]  Phone:  [phone]  Fax:  [phone]  DEA #:  [id number]  DAW Reason: --     Medication Adherence -Patient reports adherence with medications.  -Current diabetes medications include: Lantus 50 units daily, Novolog ICR 1:12, ISF 1:30, target BG 120 -Prior diabetes medications include: none  Patient denies taking hydroxyurea and/or >4 g of APAP.  Dexcom G6 patient education Person(s)instructed: patient, grandfather  Instruction: Patient oriented to three components of Dexcom G6 continuous glucose monitor (sensor, transmitter, receiver/cellphone) Receiver or cellphone: cellphone -Dexcom G6 AND dexcom clarity app downloaded onto cellphone  -Patient educated that Dexom G6 app must always be running (patient should not close out of app) -If using Dexcom G6 app, patient may share blood glucose data with up to 10 followers on dexcom follow app.  CGM overview and set-up  1. Button, touch screen, and icons 2. Power supply and recharging 3. Home screen 4. Date and time 5. Set BG target range 6. Set alarm/alert tone  7. Interstitial vs. capillary blood glucose readings  8. When to verify sensor reading with fingerstick blood glucose 9. Blood glucose  reading measured every five minutes. 10. Sensor will last 10 days 11. Transmitter will last 90 days and must be reused  12. Transmitter must be within 20 feet of receiver/cell phone.  Sensor application -- sensor placed on back of upper right buttocks 1. Site selection and site prep with alcohol pad 2. Sensor prep-sensor pack and sensor applicator 3. Sensor applied to area away from waistband, scarring, tattoos, irritation, and bones 4. Transmitter sanitized with alcohol pad and inserted into sensor. 5. Starting the sensor: 2 hour warm up before BG readings available 6. Sensor change every 10 days and rotate site 7. Call Dexcom customer service if sensor comes off before 10 days  Safety and Troubleshooting 1. Do a fingerstick blood glucose test if the sensor readings do not match how    you feel 2. Remove sensor prior to magnetic resonance imaging (MRI), computed tomography (CT) scan, or high-frequency electrical heat (diathermy) treatment. 3. Do not allow sun screen or insect repellant to come into contact with Dexcom G6. These skin care products may lead for the plastic used in the Dexcom G6 to crack. 4. Dexcom G6 may be worn through a Environmental education officer. It may not be exposed to an advanced Imaging Technology (AIT) body scanner (also called a millimeter wave scanner) or the baggage x-ray machine. Instead, ask for hand-wanding or full-body pat-down and visual inspection.  5. Doses of acetaminophen (Tylenol) >1 gram every 6 hours may cause false high readings. 6. Hydroxyurea (Hydrea, Droxia) may interfere with accuracy of blood glucose readings from Dexcom G6. 7. Store sensor kit between 36 and 86 degrees Farenheit. Can be refrigerated within this temperature range.  Contact information provided for [facility] customer service and/or trainer.  O:   Labs:    There were no vitals filed for this visit.  HbA1c Lab Results  Component Value Date   HGBA1C >14 04/14/2022   HGBA1C  >14.0 (H) 01/16/2022   HGBA1C >15.5 (H) 11/07/2021    Pancreatic Islet Cell Autoantibodies Lab Results  Component Value Date   ISLETAB <5 07/21/2016    Insulin Autoantibodies Lab Results  Component Value Date   INSULINAB 4.6 (H) 07/21/2016    Glutamic Acid Decarboxylase Autoantibodies Lab Results  Component Value Date   GLUTAMICACAB <5 07/21/2016    ZnT8 Autoantibodies No results found for: "ZNT8AB"  IA-2 Autoantibodies No results found for: "LABIA2"  C-Peptide Lab Results  Component Value Date   CPEPTIDE <0.10 (L) 11/13/2017    Microalbumin Lab Results  Component Value Date   MICRALBCREAT 19 01/16/2022    Lipids    Component Value Date/Time   CHOL 207 (H) 01/16/2022 1431   TRIG 57 01/16/2022 1431   HDL 72 01/16/2022 1431   CHOLHDL 2.9 01/16/2022 1431   LDLCALC 121 (H) 01/16/2022 1431    Assessment: Dexcom G6 CGM placed on back of patient's upper right buttocks successfully. Synched patient's Dexcom Clarity account to [facility] Clarity account. Discussed difference between glucose reading from blood vs interstitial fluid, how to interpret Dexcom arrows, how to order Dexcom sensor overpatches, and use of Skin Tac/Tac Away to assist with CGM adhesion/removal. Provided handout with all of this information as well.   Plan: Monitoring:  Continue wearing Dexcom G6 CGM Keith [name] has a diagnosis of diabetes, checks blood glucose readings > 4x per day, treats with > 3 insulin injections or wears an insulin pump, and requires frequent adjustments to insulin regimen. This patient will be seen every six months, minimally, to assess adherence to their CGM regimen and diabetes treatment plan. Follow Up: when pump arrives  Written patient instructions provided.    This appointment required 60 minutes of patient care (this includes precharting, chart review, review of results, face-to-face care, etc.).  Thank you for involving clinical  pharmacist/diabetes educator to assist in providing this patient's care.  Drexel Iha, PharmD, BCACP, [place], CPP

## 2022-05-12 ENCOUNTER — Telehealth (INDEPENDENT_AMBULATORY_CARE_PROVIDER_SITE_OTHER): Payer: Self-pay | Admitting: Pharmacist

## 2022-05-12 NOTE — Telephone Encounter (Signed)
Submitted Tandem pump order via [email or web address] and [email or web address]      Thank you for involving clinical pharmacist/diabetes educator to assist in providing this patient's care.   Zachery Conch, PharmD, BCACP, CDCES, CPP

## 2022-05-30 ENCOUNTER — Telehealth (INDEPENDENT_AMBULATORY_CARE_PROVIDER_SITE_OTHER): Payer: Self-pay | Admitting: "Endocrinology

## 2022-05-30 ENCOUNTER — Telehealth (INDEPENDENT_AMBULATORY_CARE_PROVIDER_SITE_OTHER): Payer: Self-pay | Admitting: Pharmacist

## 2022-05-30 ENCOUNTER — Encounter (INDEPENDENT_AMBULATORY_CARE_PROVIDER_SITE_OTHER): Payer: Self-pay

## 2022-05-30 NOTE — Telephone Encounter (Signed)
Spoke with [employer]. Gave him [employer]' information and let him know that I havent heard from anyone from [employer].

## 2022-05-30 NOTE — Telephone Encounter (Signed)
Please advise family to contact Cristal Deer Potocnik  (Tandem pump rep) at [phone] for updates regarding tandem pump  Thank you for involving clinical pharmacist/diabetes educator to assist in providing this patient's care.   Zachery Conch, PharmD, BCACP, CDCES, CPP

## 2022-05-30 NOTE — Telephone Encounter (Signed)
  Name of who is calling: Darius Bump Relationship to Patient: Derrel Nip contact number: [phone] Duffy Rhody [phone] Janeal Holmes)  Provider they see: Dr. Fransico Michael  Reason for call: He is calling in regards to the new pump. He is asking when it will be in and where will it be sent to.

## 2022-05-30 NOTE — Telephone Encounter (Signed)
  Name of who is calling: Darius Bump Relationship to Patient: grandpa  Best contact number: [phone] Keith [name] [phone]  Provider they see: Dr. Fransico Michael  Reason for call: Keith [name] is asking did we receive a fax from [employer] and did we fill them out and send them back?     PRESCRIPTION REFILL ONLY  Name of prescription:  Pharmacy:

## 2022-06-05 ENCOUNTER — Telehealth (INDEPENDENT_AMBULATORY_CARE_PROVIDER_SITE_OTHER): Payer: Self-pay | Admitting: Pharmacist

## 2022-06-05 NOTE — Telephone Encounter (Signed)
Called back, she had read the information wrong and everything is good.  She did ask if we received paperwork from [place].  They were to fax some papers for his medical stuff.  I told her I will route this to Dr. Loren Racer medical assistant to see if she has received them and to follow up. She was thankful.

## 2022-06-05 NOTE — Telephone Encounter (Signed)
  Name of who is calling:  Caller's Relationship to Patient:   Best contact number: [phone]  Provider they see:  Drexel Iha  Reason for call: Calling about his pump, medical info is needed so that his pump can be mailed to him, please call and let her know what she needs to do     [place]  Name of prescription:  Pharmacy:

## 2022-06-06 ENCOUNTER — Encounter (INDEPENDENT_AMBULATORY_CARE_PROVIDER_SITE_OTHER): Payer: Self-pay

## 2022-06-06 NOTE — Telephone Encounter (Signed)
I sent a message vis my chart regarding [place] paperwork. We have not received anything. I can not call the mother back. She is not on his DPR to speak with.

## 2022-06-14 ENCOUNTER — Encounter (HOSPITAL_COMMUNITY): Payer: Self-pay

## 2022-06-14 ENCOUNTER — Emergency Department (HOSPITAL_COMMUNITY)
Admission: EM | Admit: 2022-06-14 | Discharge: 2022-06-14 | Disposition: A | Discharge status: Home / Self Care | Payer: Medicaid Other | Attending: Emergency Medicine | Admitting: Emergency Medicine

## 2022-06-14 ENCOUNTER — Other Ambulatory Visit: Payer: Self-pay

## 2022-06-14 DIAGNOSIS — Z5321 Procedure and treatment not carried out due to patient leaving prior to being seen by health care provider: Secondary | ICD-10-CM | POA: Diagnosis not present

## 2022-06-14 DIAGNOSIS — M7989 Other specified soft tissue disorders: Secondary | ICD-10-CM | POA: Diagnosis not present

## 2022-06-14 DIAGNOSIS — M79645 Pain in left finger(s): Secondary | ICD-10-CM | POA: Diagnosis present

## 2022-06-14 NOTE — Progress Notes (Deleted)
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 results found for this or any previous visit (from the past 672 hour(s)).  Labs 04/14/22: HbA1c >14, CBG 391; CBC normal, except MCH 26.6 (ref 27-33), iron 105 (ref 27-164)  Labs 01/16/22: HbA1c >14%, CBG 296; TSH 0.95, free T4 1.3, free T3 3.4; CMP normal, except glucose 272; cholesterol 207, triglycerides 57, HDL 72, LDL 121; urinary microalbumin/creatinine ratio 19  Labs 11/06/21: Serum CO2 8, BHOB 6.7 (ref 0.05-9.270, venous pH 7.078.  Labs 10/04/21: HbA1c >14%, CBG 296  Labs 04/29/21: HbA1c >15.5%; BHOB 2.26 (ref 0.05-0.27), venous pH 7.312, serum potassium 2.9, serum osmolality 294 (ref 275-295);  U/A urine glucose >500, urine ketones 80  Labs 11/30/20: HbA1c >14%, CBG 600, dipstick positive for glucose and large ketones; TSH 1.27, free T4 1.3, free T3 3.0; CMP normal, except glucose 285, total protein 5.8 (ref 6.3-8.2), and globulin 1.7 (ref 2.1-3.5); CBC normal; iron 66 (ref 27-164); urinary microalbumin/creatinine ratio too low to measure  Labs 11/28/20: CMP normal, except glucose 309, potassium 3.4; CBC normal; U/A >500 glucose, 80 ketones  Labs 02/02/20: HbA1c >14%, CBG 336; Urine ketones are large.  Labs 11/13/19: HbA1c 12.2%, CBG 446; TSH 1.14, free T4 1.2, free T3 2.9; CMP normal, except glucose 359 and slightly low globulins; cholesterol 183, triglycerides 90, HDL 62, LDL 121; microalbumin/creatinine ratio too low to measure; urinalysis with large ketones;    Labs 09/04/18: CBG 54  Labs 07/25/18: HbA1c >14%.CBG 192  Labs 11/13/17: HbA1c 9.4%, CBG 84; TSH 1.23, free T4 1.1, free T3 3.6; C-peptide <0.10;   Labs 07/03/17: HbA1c >14%. CBG is 389. Urine ketones were large. At 11:30 AM the BG was 284. He took a correction dose of Novolog.   Labs 06/05/17: CBG 142  Labs 05/16/17: HbA1c 12.3%; glucose 370, CO2 27, BHOB 0.56 (ref 0.05-0.27);  venous pH 7.362  Labs 02/15/17: HbA1c 9.5%, CBG 307  Labs 11/29/16: HbA1c 9.7%, CBG 399  Labs 09/26/16: CBG 235  Labs 07/21/16: TSH 1.21, free T4 1.1, free T3 4.0; C-peptide 0.91 (ref 0.80-3.85), anti-GAD antibody <5, anti-ICA <5, anti-insulin antibodies 4.6 (ref <5); CMP  normal except for glucose 67  Labs 07/20/16: HbA1c 6.2%, CBG 99  Labs 03/24/16: HbA1c 6.2%  IMAGING:  Bone age 07/25/18: Bone age was read as 43 years at a chronologic age of 76 years and 11 months. The BA was normal.    Assessment and Plan:  Assessment  ASSESSMENT:  1. Uncontrolled T1DM:   A. Johnothan's BGs are still too high due to his own noncompliance. He appears to frequently be missing both his Lantus and his Novolog insulins.    B. He says the wants to have a new T-Slim pump and Dexcom.  2. Hypoglycemia: He says he is not having any low BG symptoms, but did have a 68 recently in the midday.  3. Dehydration: He appears to be well hydrated today. somewhat dehydrated today,  4-5. Goiter/thyroiditis: His thyroid gland is enlarged today, but the lobes have shifted in size yet again. His thyroiditis is clinically quiescent. He was euthyroid in February 2021. It was likely that he would follow in the footsteps of his maternal grandmother and become hypothyroid in the future. We need to repeat his TFTs. 6-7. Growth delay, physical/unintentional weight loss: His height growth has plateaued. His weight has decreased since his last visit. It appears that his weight loss is due to severe underinsulinization.  8. Tinea pedis: His tinea is still active. He needs to use the ketoconazole every day.   9-12. Adjustment reaction/hallucinations/deliberate self-cutting/PTSD/bipolar disorder:   A. These problems had improved when he was taking psych meds and undergoing counseling.  B. Unfortunately, these problems worsened after he stopped taking those medications.  I suggested at his last visit with me that the family contact Dr.  Migdalia Dk and Dr. Hetty Ely for their recommendations and referral to a local psychiatrist. Those actions did not happen.  C. It is important that Keith [name] be evaluated by a psychiatrist and started back on psych medications.  13. Peripheral neuropathy: He has no evidence for peripheral neuropathy today. 14. Autonomic neuropathy: He still has inappropriate sinus tachycardia. He also has bloating and constipation, c/w gastroparesis and colonoparesis.  15. Nausea: He may have had more retention of gastric acid due to gastroparesis, but says that he is doing better now . Omeprazole may help if he will take it.   Nail bed pallor: We need to check his CBC and iron.   PLAN:  1. Diagnostic: HbA1c, TFTs, CMP, lipid panel, microalbumin/creatinine ratio. Bring in his BG.  2. Therapeutic: Take 50 units of Lantus and follow his Novolog 120/30/12 plan. I will request that our diabetes educator get him started back on a Dexcom and T-Slim pump. Start omeprazole, 20 mg, twice daily.  3. Patient education: We discussed all of the above, to include the need to take better control of his BG.  4. Follow-up: in [place]  Level of Service: This visit lasted in excess of 75 minutes. More than 50% of the visit was devoted to counseling.   Tillman Sers, MD, CDE Pediatric and Adult Endocrinology

## 2022-06-14 NOTE — ED Triage Notes (Addendum)
Pt reports left thumb pain at nail bed with redness and swelling x3 days. Pt reports red streaking up arm.  Pt reports 3 hours ago pain radiating to axillary of left arm.

## 2022-06-15 ENCOUNTER — Ambulatory Visit (INDEPENDENT_AMBULATORY_CARE_PROVIDER_SITE_OTHER): Payer: Medicaid Other | Admitting: "Endocrinology

## 2022-06-15 NOTE — Progress Notes (Deleted)
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 tender to palpation and pressure.  Neurologic: Strength is normal for age in both the upper and lower extremities. Muscle tone is normal. Sensation to touch is normal in both legs and in both feet.   LAB DATA:   No results found for this or any previous visit (from the past 672 hour(s)).  Labs 04/14/22: HbA1c >14, CBG 391; CBC normal, except MCH 26.6 (ref 27-33), iron 105 (ref 27-164)  Labs 01/16/22: HbA1c >14%, CBG 296; TSH 0.95, free T4 1.3, free T3 3.4; CMP normal, except glucose 272; cholesterol 207, triglycerides 57, HDL 72, LDL 121; urinary microalbumin/creatinine ratio 19  Labs 11/06/21: Serum CO2 8, BHOB 6.7 (ref 0.05-9.270, venous pH 7.078.  Labs 10/04/21: HbA1c >14%, CBG 296  Labs 04/29/21: HbA1c >15.5%; BHOB 2.26 (ref 0.05-0.27), venous pH 7.312, serum potassium 2.9, serum osmolality 294 (ref 275-295);  U/A urine glucose >500, urine ketones 80  Labs 11/30/20: HbA1c >14%, CBG 600, dipstick positive for glucose and large ketones; TSH 1.27, free T4 1.3, free T3 3.0; CMP normal, except glucose 285, total protein 5.8 (ref 6.3-8.2), and globulin 1.7 (ref 2.1-3.5); CBC normal; iron 66 (ref 27-164); urinary microalbumin/creatinine ratio too low to measure  Labs 11/28/20: CMP normal, except glucose 309, potassium 3.4; CBC normal; U/A >500 glucose, 80 ketones  Labs 02/02/20: HbA1c >14%, CBG 336; Urine ketones are large.  Labs 11/13/19: HbA1c 12.2%, CBG 446; TSH 1.14, free T4 1.2, free T3 2.9; CMP normal, except glucose 359 and slightly low globulins; cholesterol 183, triglycerides 90, HDL 62, LDL 121; microalbumin/creatinine ratio too low to measure; urinalysis with large ketones;    Labs 09/04/18: CBG 54  Labs 07/25/18: HbA1c >14%.CBG 192  Labs 11/13/17: HbA1c 9.4%, CBG 84; TSH 1.23, free T4 1.1, free  T3 3.6; C-peptide <0.10;   Labs 07/03/17: HbA1c >14%. CBG is 389. Urine ketones were large. At 11:30 AM the BG was 284. He took a correction dose of Novolog.   Labs 06/05/17: CBG 142  Labs 05/16/17: HbA1c 12.3%; glucose 370, CO2 27, BHOB 0.56 (ref 0.05-0.27); venous pH  7.362  Labs 02/15/17: HbA1c 9.5%, CBG 307  Labs 11/29/16: HbA1c 9.7%, CBG 399  Labs 09/26/16: CBG 235  Labs 07/21/16: TSH 1.21, free T4 1.1, free T3 4.0; C-peptide 0.91 (ref 0.80-3.85), anti-GAD antibody <5, anti-ICA <5, anti-insulin antibodies 4.6 (ref <5); CMP normal except for glucose 67  Labs 07/20/16: HbA1c 6.2%, CBG 99  Labs 03/24/16: HbA1c 6.2%  IMAGING:  Bone age 04/24/18: Bone age was read as 25 years at a chronologic age of 27 years and 11 months. The BA was normal.    Assessment and Plan:  Assessment  ASSESSMENT:  1. Uncontrolled T1DM:   A. Dilan's BGs are still too high due to his own noncompliance. He appears to frequently be missing both his Lantus and his Novolog insulins.    B. He says the wants to have a new T-Slim pump and Dexcom.  2. Hypoglycemia: He says he is not having any low BG symptoms, but did have a 68 recently in the midday.  3. Dehydration: He appears to be well hydrated today. somewhat dehydrated today,  4-5. Goiter/thyroiditis: His thyroid gland is enlarged today, but the lobes have shifted in size yet again. His thyroiditis is clinically quiescent. He was euthyroid in February 2021. It was likely that he would follow in the footsteps of his maternal grandmother and become hypothyroid in the future. We need to repeat his TFTs. 6-7. Growth delay, physical/unintentional weight loss: His height growth has plateaued. His weight has decreased since his last visit. It appears that his weight loss is due to severe underinsulinization.  8. Tinea pedis: His tinea is still active. He needs to use the ketoconazole every day.   9-12. Adjustment reaction/hallucinations/deliberate  self-cutting/PTSD/bipolar disorder:   A. These problems had improved when he was taking psych meds and undergoing counseling.  B. Unfortunately, these problems worsened after he stopped taking those medications.  I suggested at his last visit with me that the family contact Dr. Migdalia Dk and Dr. Hetty Ely for their recommendations and referral to a local psychiatrist. Those actions did not happen.  C. It is important that Keith [name] be evaluated by a psychiatrist and started back on psych medications.  13. Peripheral neuropathy: He has no evidence for peripheral neuropathy today. 14. Autonomic neuropathy: He still has inappropriate sinus tachycardia. He also has bloating and constipation, c/w gastroparesis and colonoparesis.  15. Nausea: He may have had more retention of gastric acid due to gastroparesis, but says that he is doing better now . Omeprazole may help if he will take it.   Nail bed pallor: We need to check his CBC and iron.   PLAN:  1. Diagnostic: HbA1c, TFTs, CMP, lipid panel, microalbumin/creatinine ratio. Bring in his BG.  2. Therapeutic: Take 50 units of Lantus and follow his Novolog 120/30/12 plan. I will request that our diabetes educator get him started back on a Dexcom and T-Slim pump. Start omeprazole, 20 mg, twice daily.  3. Patient education: We discussed all of the above, to include the need to take better control of his BG.  4. Follow-up: in [place]  Level of Service: This visit lasted in excess of 75 minutes. More than 50% of the visit was devoted to counseling.   Tillman Sers, MD, CDE Pediatric and Adult Endocrinology

## 2022-06-16 ENCOUNTER — Telehealth (INDEPENDENT_AMBULATORY_CARE_PROVIDER_SITE_OTHER): Payer: Self-pay | Admitting: "Endocrinology

## 2022-06-16 ENCOUNTER — Ambulatory Visit (INDEPENDENT_AMBULATORY_CARE_PROVIDER_SITE_OTHER): Payer: Medicaid Other | Admitting: "Endocrinology

## 2022-06-16 NOTE — Telephone Encounter (Signed)
  Name of who is calling: Dorothyann Peng (DPR on file)  Caller's Relationship to Patient: Jon Gills   Best contact number: [phone]  Provider they see: [name]  Reason for call: Have we received paperwork from [place]? Please call Dorothyann Peng and let him know. Patient is not able to make today's scheduled appointment because he is in the hospital.      [place]  Name of prescription:  Pharmacy:

## 2022-06-19 NOTE — Telephone Encounter (Signed)
LVM with call back number.

## 2022-06-23 NOTE — Telephone Encounter (Signed)
LVM with call back number.

## 2022-07-05 ENCOUNTER — Ambulatory Visit (INDEPENDENT_AMBULATORY_CARE_PROVIDER_SITE_OTHER): Payer: Self-pay | Admitting: Pediatric Endocrinology

## 2022-08-18 ENCOUNTER — Telehealth (INDEPENDENT_AMBULATORY_CARE_PROVIDER_SITE_OTHER): Payer: Self-pay | Admitting: "Endocrinology

## 2022-08-18 NOTE — Telephone Encounter (Signed)
  Name of who is calling: Darius Bump Relationship to Patient: Actor (DPR on file)  Best contact number: [phone]  Provider they see: Previous Fransico Michael patient; scheduled to see Dr. Vanessa Mason  Reason for call: Stated they are waiting to receive patient's Tandem pump supplies. Tandem advised they are waiting on documentation from [place].   I discussed no shows with grandfather and scheduled appointment with Dr. Vanessa [place] in January.   Please advise.      PRESCRIPTION REFILL ONLY  Name of prescription:  Pharmacy:

## 2022-08-21 NOTE — Telephone Encounter (Addendum)
I haven't received anything

## 2022-09-26 ENCOUNTER — Ambulatory Visit (INDEPENDENT_AMBULATORY_CARE_PROVIDER_SITE_OTHER): Payer: Medicaid Other | Admitting: Pediatric Endocrinology

## 2022-10-08 ENCOUNTER — Encounter (HOSPITAL_COMMUNITY): Payer: Self-pay

## 2022-10-13 ENCOUNTER — Telehealth (INDEPENDENT_AMBULATORY_CARE_PROVIDER_SITE_OTHER): Payer: Self-pay | Admitting: Pediatric Endocrinology

## 2022-10-13 NOTE — Telephone Encounter (Signed)
  Name of who is calling:Keith [name]   Caller's Relationship to Patient:Grandfather   Best contact number:[phone]   Provider they see:Dr. Baldo Ash   Reason for call:Grandfather stated that he just go the pump and had a few questions about it and has asked for a call back. Please advise      PRESCRIPTION REFILL ONLY  Name of prescription:  Pharmacy:

## 2022-10-15 NOTE — Telephone Encounter (Signed)
Now that they have the pump they need to schedule a pump start class.   Actually- I haven't seen him since [place] and he has TWO recent DKA admissions- so they also need to schedule with me and come to their visit before he will be able to start on pump.   Dr. Baldo Ash

## 2022-10-26 ENCOUNTER — Encounter (INDEPENDENT_AMBULATORY_CARE_PROVIDER_SITE_OTHER): Payer: Self-pay | Admitting: Pediatric Endocrinology

## 2022-10-26 ENCOUNTER — Ambulatory Visit (INDEPENDENT_AMBULATORY_CARE_PROVIDER_SITE_OTHER): Payer: Medicaid Other | Admitting: Pediatric Endocrinology

## 2022-10-26 VITALS — BP 122/70 | HR 76 | Wt 110.2 lb

## 2022-10-26 DIAGNOSIS — E1065 Type 1 diabetes mellitus with hyperglycemia: Secondary | ICD-10-CM

## 2022-10-26 DIAGNOSIS — G5792 Unspecified mononeuropathy of left lower limb: Secondary | ICD-10-CM | POA: Diagnosis not present

## 2022-10-26 LAB — POCT URINALYSIS DIPSTICK
Glucose, UA: POSITIVE — AB
Ketones, UA: NEGATIVE

## 2022-10-26 LAB — POCT GLYCOSYLATED HEMOGLOBIN (HGB A1C): HbA1c POC (<> result, manual entry): >14 % (ref 4.0–5.6)

## 2022-10-26 LAB — POCT GLUCOSE (DEVICE FOR HOME USE)

## 2022-10-26 MED ORDER — BAQSIMI TWO PACK 3 MG/DOSE NA POWD: 1.0000 "application " | NASAL | 1 refills | Status: AC | PRN

## 2022-10-26 MED ORDER — URINE GLUCOSE-KETONES TEST VI STRP: 1.0000 | ORAL_STRIP | 8 refills | Status: AC | PRN

## 2022-10-26 NOTE — Patient Instructions (Addendum)
  1) Eye doctor visit needs to be scheduled  2) Set up pump start  3) Neurology for neuropathy- referral placed to [facility]  4) Podiatry for your feet - referral placed to [place] current insulin doses.   ON YOUR PHONE  [place] with the same credentials we used for your dexcom  Bottom edge- click "profile" Click "authorize sharing" Click "accept invitation from clinic  [id number]  To invite your family to see your Dexcom They need "Dexcom Follow" app  On your G7 App- go to "sharing" Send invites to email
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
## 2022-11-17 ENCOUNTER — Ambulatory Visit: Payer: Medicaid Other | Admitting: Podiatry

## 2022-11-21 NOTE — Progress Notes (Signed)
S:     No chief complaint on file.   Endocrinology provider: Dr. Baldo Ash  Patient referred to me by Dr. Baldo Ash for tandem t:slim X2 insulin pump training. PMH significant for T1DM (dx 08/03/15), goiter, peripheral neuropathy, hx of self harm (self-cutting), noncompliance with diabetes treatment. Patient is currently using Dexcom *** CGM. He reports taking Lantus 20 units twice daily *** and Novolog ICR 1:12 ISF 1:30 target BG 120 (day) and 200 (bed)***. Basal dose last administered ***.  Patient presents today with ***.   Prepump Survey Responses Email address: [email or web address], [email or web address] Long acting insulin, dose, time administered: Lantus 50 units daily around 10 pm Rapid acting insulin: Novolog ICR 1:12, ISF 1:30, target BG 120 Breakfast time (10-11 am): 10-15 units Lunch time (4 hours after): ~6 units Dinner time (4 hours): ~8 units  Insurance: Traditional [place] Medicaid  DME Supplier: [facility]  Pump Serial Number: ***  Infusion Set: ***  Tandem T:Slim X2 Insulin Pump Education Training Please refer to Insulin Pump Training Checklist scanned into media  Labs:  Dexcom Clarity Report ***   There were no vitals filed for this visit.  HbA1c Lab Results  Component Value Date   HGBA1C >14 10/26/2022   HGBA1C >14 04/14/2022   HGBA1C >14.0 (H) 01/16/2022    Pancreatic Islet Cell Autoantibodies Lab Results  Component Value Date   ISLETAB <5 07/21/2016    Insulin Autoantibodies Lab Results  Component Value Date   INSULINAB 4.6 (H) 07/21/2016    Glutamic Acid Decarboxylase Autoantibodies Lab Results  Component Value Date   GLUTAMICACAB <5 07/21/2016    ZnT8 Autoantibodies No results found for: "ZNT8AB"  IA-2 Autoantibodies No results found for: "LABIA2"  C-Peptide Lab Results  Component Value Date   CPEPTIDE <0.10 (L) 11/13/2017    Microalbumin Lab Results  Component Value Date   MICRALBCREAT 19 01/16/2022    Lipids     Component Value Date/Time   CHOL 207 (H) 01/16/2022 1431   TRIG 57 01/16/2022 1431   HDL 72 01/16/2022 1431   CHOLHDL 2.9 01/16/2022 1431   LDLCALC 121 (H) 01/16/2022 1431    Assessment: Pump Settings - ***. Change target BG to 110 mg/dL considering hybrid closed loop algorithm. Continue wearing t:slim X2 with control IQ technology and Dexcom *** CGM. Follow up ***  Pump Education - Tandem t:slim X2 Insulin pump applied successfully to ***. Insulin pump was synced with Dexcom *** CGM to use Control IQ technology. Parents appeared to have sufficient understanding of subjects discussed during Tandem t:slim X2 insulin pump training appt.   Plan:  Pump Settings  Time Basal (Max Basal: *** units/hr) Correction Factor Carb Ratio (Max Bolus: *** units) Target BG                                 Total:  *** units         Tandem T:Slim X2 Insulin Pump  Continue to wear Tandem T:Slim insulin pump and change infusion set site every 3 days (cartridge filled *** units) Thoroughly discussed how to assess bad infusion site change and appropriate management (notice BG is elevated, attempt to bolus via pump, recheck BG in 30 minutes, if BG has not decreased then disconnect pump and administer bolus via insulin pen, apply new infusion set, and repeat process).  Discussed back up plan if pump breaks (how to calculate insulin doses using insulin pens). Provided written copy of patient's current  pump settings and handout explaining math on how to calculate settings. Discussed examples with family. Patient was able to use teach back method to demonstrate understanding of calculating dose for basal/bolus insulin pens from insulin pump settings.  Patient has Lantus and Novolog insulin pen refills to use as back up. Reminded family they will need a new prescription annually.  Reimbursement Faxed invoice and training checklist to Tandem Follow Up:  ***  This appointment required 120 minutes of patient  care (this includes precharting, chart review, review of results, face-to-face care, etc.).  Thank you for involving clinical pharmacist/diabetes educator to assist in providing this patient's care.  Drexel Iha, PharmD, BCACP, [place], CPP

## 2022-11-22 ENCOUNTER — Ambulatory Visit (INDEPENDENT_AMBULATORY_CARE_PROVIDER_SITE_OTHER): Payer: Medicaid Other | Admitting: Pharmacist

## 2022-11-22 ENCOUNTER — Encounter (INDEPENDENT_AMBULATORY_CARE_PROVIDER_SITE_OTHER): Payer: Self-pay | Admitting: Pharmacist

## 2022-11-22 ENCOUNTER — Encounter: Payer: Self-pay | Admitting: Podiatry

## 2022-11-22 ENCOUNTER — Ambulatory Visit (INDEPENDENT_AMBULATORY_CARE_PROVIDER_SITE_OTHER): Payer: Medicaid Other | Admitting: Podiatry

## 2022-11-22 ENCOUNTER — Telehealth (INDEPENDENT_AMBULATORY_CARE_PROVIDER_SITE_OTHER): Payer: Self-pay | Admitting: Pharmacist

## 2022-11-22 DIAGNOSIS — M79675 Pain in left toe(s): Secondary | ICD-10-CM | POA: Diagnosis not present

## 2022-11-22 DIAGNOSIS — M79674 Pain in right toe(s): Secondary | ICD-10-CM | POA: Diagnosis not present

## 2022-11-22 DIAGNOSIS — E1042 Type 1 diabetes mellitus with diabetic polyneuropathy: Secondary | ICD-10-CM

## 2022-11-22 DIAGNOSIS — B353 Tinea pedis: Secondary | ICD-10-CM

## 2022-11-22 DIAGNOSIS — E1065 Type 1 diabetes mellitus with hyperglycemia: Secondary | ICD-10-CM

## 2022-11-22 DIAGNOSIS — B351 Tinea unguium: Secondary | ICD-10-CM | POA: Diagnosis not present

## 2022-11-22 MED ORDER — INSULIN ASPART 100 UNIT/ML IJ SOLN: INTRAMUSCULAR | 3 refills | Status: DC

## 2022-11-22 MED ORDER — KETOCONAZOLE 2 % EX CREA: 1.0000 | TOPICAL_CREAM | Freq: Every day | CUTANEOUS | 2 refills | Status: DC

## 2022-11-22 MED ORDER — DEXCOM G7 SENSOR MISC: 1.0000 | 5 refills | Status: DC

## 2022-11-22 NOTE — Patient Instructions (Signed)
It was a pleasure seeing you today!  PLEASE REMEMBER USE DEXCOM G7 THAT IS UNDERLINED KEEP DEXCOM [place] APP AND TCONNECT APP RUNNING ON YOUR PHONE CHANGE OUT AUTOSOFT XC SITES TO TRUSTEEL SITES (6 MM CANNULA, 23 INCH TUBING) ENTER 30 GRAMS INTO YOUR PUMP WHEN YOU BOLUS   Please refer to this video when you are changing your pump site. You will fill your cartridge up with 300 units of insulin (will be 3 mL on your syringe)  How to fill up insulin cartridge  [email or web address]-murphy.[email or web address]  How to change pump site  Autosoft Sites: https://www.rice.info/   TruSteel Sites: BuffaloDryCleaner.gl   How to change Dexcom sensor (change on PUMP first - it will connect to cell phone after)  Dexcom G6 CGM, a Step-by-Step Tutorial + tips. Tandem t:slim X2 Insulin Pump & iPhone Connected - YouTube  How To Change The Dexcom G6 With The T:Slim X2 Insulin Pump! ??  TypeOneLiv - YouTube  How to change Dexcom transmitter (change on PUMP first - it will connect to cell phone after)  Starting a Production assistant, radio without removing an Old Sensor on the t:slim [place]  The Diabetic Cactus - YouTube  Pump Failure Plan  DIABETES PLAN  Rapid Acting Insulin (Novolog/FiASP (Aspart) and Humalog/Lyumjev (Lispro))  **Given for Food/Carbohydrates and High Sugar/Glucose**   DAYTIME (breakfast, lunch, dinner) Target Blood Glucose 120 mg/dL Insulin Sensitivity Factor 30 Insulin to Carb Ratio  1 unit for 12 grams   Correction DOSE Food DOSE  (Glucose -Target)/Insulin Sensitivity Factor  Glucose (mg/dL) Units of Rapid Acting Insulin  Less than 120 0  121-150 1  151-180 2  181-210 3  211-240 4  241-270 5  271-300 6  301-330 7  331-360 8  361-390 9  391-420 10  421-450 11  451-480 12  481-510 13  511-540 14  541-570 15  571-600 16  601 or HI 17      Number of carbohydrates divided by carb  ratio  Number of Carbs Units of Rapid Acting Insulin  0-11 0  12-23 1  24-35 2  36-47 3  48-59 4  60-71 5  72-83 6  84-95 7  96-107 8  108-119 9  120-131 10  132-143 11  144-155 12  156-167 13  168-179 14  180-191 15  192+  (# carbs divided by 12)                  **Correction Dose + Food Dose = Number of units of rapid acting insulin **  Correction for High Sugar/Glucose Food/Carbohydrate  Measure Blood Glucose BEFORE you eat. (Fingerstick with Glucose Meter or check the reading on your Continuous Glucose Meter).  Use the table above or calculate the dose using the formula.  Add this dose to the Food/Carbohydrate dose if eating a meal.  Correction should not be given sooner than every 3 hours since the last dose of rapid acting insulin. 1. Count the number of carbohydrates you will be eating.  2. Use the table above or calculate the dose using the formula.  3. Add this dose to the Correction dose if glucose is above target.         BEDTIME Target Blood Glucose 200 mg/dL Insulin Sensitivity Factor 30 Insulin to Carb Ratio  1 unit for 12 grams   Wait at least 3 hours after taking dinner dose of insulin BEFORE checking bedtime glucose.   Blood Sugar Less Than  120 mg/dL? Blood Sugar Between 121 - 199 mg/dL? Blood Sugar Greater Than '200mg'$ /dL?  You MUST EAT 15 carbs  1. Carb snack not needed  Carb snack not needed    2. Additional, Optional Carb Snack?  If you want more carbs, you CAN eat them now! Make sure to subtract MUST EAT carbs from total carbs then look at chart below to determine food dose. 2. Optional Carb Snack?   You CAN eat this! Make sure to add up total carbs then look at chart below to determine food dose. 2. Optional Carb Snack?   You CAN eat this! Make sure to add up total carbs then look at chart below to determine food dose.  3. Correction Dose of Insulin?  NO  3. Correction Dose of Insulin?  NO 3. Correction Dose of Insulin?  YES;  please look at correction dose chart to determine correction dose.   Glucose (mg/dL) Units of Rapid Acting Insulin  Less than 200 0  201-230 1  231-260 2  261-290 3  291-320 4  321-350 5  351-380 6  381-410 7  411-440 8  441-470 9  471-500 10  501-530 11  531-560 12  561-590 13  591 or more 14        Number of Carbs Units of Rapid Acting Insulin  0-11 0  12-23 1  24-35 2  36-47 3  48-59 4  60-71 5  72-83 6  84-95 7  96-107 8  108-119 9  120-131 10  132-143 11  144-155 12  156-167 13  168-179 14  180-191 15  192+  (# carbs divided by 12)           Long Acting Insulin (Glargine (Basaglar/Lantus/Semglee)/Levemir/Tresiba)  **Remember long acting insulin must be given EVERY DAY, and NEVER skip this dose**                                    Give Lantus 20 units twice daily    If you have any questions/concerns PLEASE call [phone] to speak to the on-call  Pediatric Endocrinology provider at [facility].   Today the plan is... Continue to use tandem t:slim X2 insulin pump and change site every 2-3 days Make sure to set up the t:connect phone app if you have not done so already Go to tandemdiabetes.com --> support --> product support as a helpful reference for questions regarding your insulin pump If referring to the tandem website does not answer your question please feel free to reach out to me, Dr. Lovena Le, through [place] or via phone at [place] [phone] 24 hours/day 7 days a week  PUMP RENEWALS [phone] 6:00 AM to 5:00 PM  ([place]) Monday - Friday  ORDER SUPPORT [phone] 6:00 AM to 5:00 PM ([place]) Monday - Friday

## 2022-11-22 NOTE — Telephone Encounter (Signed)
Patient will require Dexcom G7 prior authorization. Submitted prior authorization to [employer] on 11/22/22   Confirmation [id number] Prior Approval [id number] Status:APPROVED   Thank you for involving clinical pharmacist/diabetes educator to assist in providing this patient's care.   Drexel Iha, PharmD, BCACP, [place], CPP

## 2022-11-22 NOTE — Progress Notes (Signed)
  Subjective:  Patient ID: Keith [name], male    DOB: [birth year],   MRN: [id number]  Chief Complaint  Patient presents with   Peripheral Neuropathy    Diabetic; Neuropathy of left lower extremity. Patient does not take any medication for neuropathy.    Nail Problem    Nail trim     22 y.o. male presents for concern of thickened elongated and painful nails that are difficult to trim. Requesting to have them trimmed today. Relates burning and tingling in their feet. Patient is diabetic and last A1c was  Lab Results  Component Value Date   HGBA1C >14 10/26/2022   .   PCP:  Sanger, Zollie Beckers, DO     . Denies any other pedal complaints. Denies n/v/f/c.   Past Medical History:  Diagnosis Date   Anxiety    Asthma    Auditory hallucinations    Cannabis hyperemesis syndrome concurrent with and due to cannabis abuse ([place])    Depression    Diabetes mellitus without complication ([place]) [id number]   New onset   Obesity    PTSD (post-traumatic stress disorder)    Social problem 08/03/2015   Unintentional weight loss 06/07/2017    Objective:  Physical Exam: Vascular: DP/PT pulses 2/4 bilateral. CFT <3 seconds. Normal hair growth on digits. No edema.  Skin. No lacerations or abrasions bilateral feet. Nails 1-5 bilateral are thickened elongated and with subungual debris. Scaling and erythema noted in mocassin like patter around bilateral feet.  Musculoskeletal: MMT 5/5 bilateral lower extremities in DF, PF, Inversion and Eversion. Deceased ROM in DF of ankle joint.  Neurological: Sensation intact to light touch.   Assessment:   1. Pain due to onychomycosis of toenails of both feet   2. Type 1 DM with polyneuropathy ([place])   3. Tinea pedis of both feet      Plan:  Patient was evaluated and treated and all questions answered. -Discussed and educated patient on diabetic foot care, especially with  regards to the vascular, neurological and musculoskeletal systems.  -Stressed  the importance of good glycemic control and the detriment of not  controlling glucose levels in relation to the foot. -Discussed supportive shoes at all times and checking feet regularly.  -Mechanically debrided all nails 1-5 bilateral using sterile nail nipper and filed with dremel without incident  -Ketoconazole prescribed.  -Answered all patient questions -Patient to return  in 3 months for at risk foot care -Patient advised to call the office if any problems or questions arise in the meantime.   Lorenda Peck, DPM

## 2022-11-24 ENCOUNTER — Telehealth (INDEPENDENT_AMBULATORY_CARE_PROVIDER_SITE_OTHER): Payer: Medicaid Other | Admitting: Pharmacist

## 2022-11-24 ENCOUNTER — Telehealth (INDEPENDENT_AMBULATORY_CARE_PROVIDER_SITE_OTHER): Payer: Self-pay

## 2022-11-24 DIAGNOSIS — E1065 Type 1 diabetes mellitus with hyperglycemia: Secondary | ICD-10-CM | POA: Diagnosis not present

## 2022-11-24 MED ORDER — DEXCOM G7 SENSOR MISC: 1.0000 | 5 refills | Status: DC

## 2022-11-24 NOTE — Progress Notes (Signed)
This is a Pediatric Specialist E-Visit (My Chart Video Visit) follow up consult provided via WebEx Keith [name] and Keith [name] (grandfather) consented to an E-Visit consult today  Location of patient: Keith [name] and Keith [name] (grandfather) are at home  Location of provider: Drexel Iha, PharmD, BCACP, CDCES, CPP is working remotely   S:     Chief Complaint  Patient presents with   Diabetes    Pump Follow Up     Endocrinology provider: Dr. Baldo Ash  Patient referred to me for insulin pump initiation and training. PMH significant for T1DM (dx 08/03/15), goiter, peripheral neuropathy, hx of self harm (self-cutting), noncompliance with diabetes treatment. Patient wears a t:slim X2 insulin pump with control IQ technology and Dexcom G7 CGM. Patient was started on the t:slim X2 insulin pump with control IQ technology insulin pump on 11/22/22.   I connected with Keith [name] and grandfather, Keith [name], on 11/24/22 by video and verified that I am speaking with the correct person using two identifiers. Patient is motivated to improve diabetes management as his schooling requires an improvement in A1c before he can attend per patient report A1c must be <7%. Patient reports he likes wearing his insulin pump - feeling much better overall and has more energy. Patient reports he has not closed out of his Tconnect app, but he did accidentally close out of his Dexcom G7 app. He reports he has seen the message all insulin deliveries have stopped on his insulin pump. They went to the pharmacy to pick up insulin but the prescription for rapid acting insulin vials was not ready yet. Grandfather reports they will be mailing Keith [name] label to exchange insulin pump sites (not emailing); grandfather has not received label at this time.   Insurance: Traditional [place] Medicaid  DME Supplier: [facility]  Pump Serial Number: [id number]  Infusion Set: TruSteel 6 mm cannula, 23 in tubing *Patient did receive  Autosoft XC 6 mm cannula, 23 in tubing sites for first order; however, at prior training appt (11/22/22) assisted patient/guardian with contacting [facility] to exchange with TruSteel infusion set sites. Patient/guardian was instructed to mail back Autosoft sites to receive TruSteel sites. Provided patient with 9 TruSteel sites to use in the interim time frame until he receives replacement. Stressed that grandfather sent back autosoft XC infusion set sites as quickly as possible.   Tandem T:Slim X2 with Control IQ Technology Insulin Pump Settings     Time Basal (Max Basal:  3 units/hr) Correction Factor Carb Ratio (Max Bolus: 15 units) Target BG  12AM 1.58 35 12 110  2AM 1.58 35 12 110  8AM 1.58 30 10 110  4PM 1.58 30 10 110  10PM 1.58 30 10 110               Total:  37.92 units            Control IQ -Weight: 50 kg -TDD: 64 units   Sleep Activity -Daily: 11PM-8AM  Tconnect Account [email or web address]  Infusion Set Sites -Patient-reports infusion set sites are abdomen  --Patient/guardian reports patient is independently doing infusion set site changes --Patient/guardian reports patient is rotating infusion set sites --Patient/guardian denies patient experiences infusion set failures  Diet: Patient/guardian reports patient's dietary habits:  Breakfast (8am-12pm; typically falls asleep to nap after): PB&J Lunch(4pm): PB&J, McDonalds, pizza Dinner (10pm): PB&J, McDonalds, pizza Snacks (2am): cinnamon toast crunch  Exercise: Patient/guardian reports patient's exercise habits: walking    O:   Labs:   Dexcom Clarity CGM Report  Tconnect and Tandem Source Reports    There is no pump data available after 11/23/22 5pm   There were no vitals filed for this visit.  HbA1c Lab Results  Component Value Date   HGBA1C >14 10/26/2022   HGBA1C >14 04/14/2022   HGBA1C >14.0 (H) 01/16/2022    Pancreatic Islet Cell Autoantibodies Lab Results  Component  Value Date   ISLETAB <5 07/21/2016    Insulin Autoantibodies Lab Results  Component Value Date   INSULINAB 4.6 (H) 07/21/2016    Glutamic Acid Decarboxylase Autoantibodies Lab Results  Component Value Date   GLUTAMICACAB <5 07/21/2016    ZnT8 Autoantibodies No results found for: "ZNT8AB"  IA-2 Autoantibodies No results found for: "LABIA2"  C-Peptide Lab Results  Component Value Date   CPEPTIDE <0.10 (L) 11/13/2017    Microalbumin Lab Results  Component Value Date   MICRALBCREAT 19 01/16/2022    Lipids    Component Value Date/Time   CHOL 207 (H) 01/16/2022 1431   TRIG 57 01/16/2022 1431   HDL 72 01/16/2022 1431   CHOLHDL 2.9 01/16/2022 1431   LDLCALC 121 (H) 01/16/2022 1431    Assessment: TIR is not at goal > 70%. No hypoglycemia. TDD is ~73 units; 36% basal, 64% bolus. Pump is only administering average total basal dose of 26.24 units when he is programmed to receive 37.92 units. Will leave basal adjustments to control IQ algorithm for now since it is administering less than what is programmed (increasing basal rates further will not impact algorithm). Considering extent of hyperglycemia (TIR 10%) he likely needs a further increase in insulin adjustments so will increase bolus doses by decreasing ICR and ISF. Based on rule of 450, ideal ICR may be 5-6. Based on rule of 1800, ideal ISF may be 21-24. Decrease ICR and ISF as documented below. Continue target BG. Continue wearing Dexcom G7 CGM and insulin pump. Stressed importance of changing infusion set sites in a timely manner and that he should not go longer than 1 hour without insulin due to risk of DKA. Patient and grandfather verbalized understanding. Also reminded patient to keep Tconnect and Dexcom G7 CGM apps open on phone. Patient is motivated to improve diabetes management as his schooling requires an improvement in A1c before he can attend per patient report A1c must be <7%. Follow up 2 weeks.  Plan: Insulin  pump settings:    Time Basal (Max Basal:  3 units/hr) Correction Factor Carb Ratio (Max Bolus: 15 units) Target BG  12AM 1.58 35 --> 30 12 --> 10 110  2AM 1.58 35 --> 30 12 --> 10 110  8AM 1.58 30 --> 25 10 --> 8 110  4PM 1.58 30 --> 25 10 --> 8 110  10PM 1.58 30 --> 25 10 --> 8  110               Total:  37.92 units            Control IQ -Weight: 50 kg -TDD: 64 units   Sleep Activity -Daily: 11PM-8AM  Education: Stressed importance of changing infusion set sites in a timely manner and that he should not go longer than 1 hour without insulin due to risk of DKA. Patient and grandfather verbalized understanding. Also reminded patient to keep Tconnect and Dexcom G7 CGM apps open on phone.  Monitoring:  Continue wearing Dexcom G7 CGM Keith [name] has a diagnosis of diabetes, checks blood glucose readings > 4x per day, wears an insulin pump, and requires frequent  adjustments to insulin regimen. This patient will be seen every six months, minimally, to assess adherence to their CGM regimen and diabetes treatment plan. Follow Up: 2 weeks   This appointment required 30 minutes of patient care (this includes precharting, chart review, review of results, virtual care, etc.).  Time Spent 11/17/22-12/17/22: 30 minutes  -11/24/22: 30 minutes (billed [phone])  Thank you for involving clinical pharmacist/diabetes educator to assist in providing this patient's care.  Drexel Iha, PharmD, BCACP, [place], CPP

## 2022-11-24 NOTE — Telephone Encounter (Signed)
[place] APPROVED Effective End Date:05/23/2023

## 2022-11-24 NOTE — Addendum Note (Signed)
Addended by: Roxy Horseman D on: 11/24/2022 09:56 AM   Modules accepted: Orders

## 2022-11-24 NOTE — Telephone Encounter (Signed)
Fax received from [facility] stating pt needs PA for [employer]. PA initiated on [employer].  Confirmation [id number]

## 2022-12-05 ENCOUNTER — Ambulatory Visit (INDEPENDENT_AMBULATORY_CARE_PROVIDER_SITE_OTHER): Payer: Medicaid Other | Admitting: Pediatric Endocrinology

## 2022-12-07 NOTE — Progress Notes (Deleted)
This is a Pediatric Specialist E-Visit (My Chart Video Visit) follow up consult provided via WebEx Keith [name] and Keith [name] (grandfather) consented to an E-Visit consult today  Location of patient: Keith [name] and Keith [name] (grandfather) are at home  Location of provider: Drexel Iha, PharmD, BCACP, CDCES, CPP is working remotely   S:     No chief complaint on file.   Endocrinology provider: Dr. Baldo Ash  Patient referred to me for insulin pump initiation and training. PMH significant for T1DM (dx 08/03/15), goiter, peripheral neuropathy, hx of self harm (self-cutting), noncompliance with diabetes treatment. Patient wears a t:slim X2 insulin pump with control IQ technology and Dexcom G7 CGM. Patient was started on the t:slim X2 insulin pump with control IQ technology insulin pump on 11/22/22.   I connected with Keith [name] and grandfather, Keith [name], on *** by video and verified that I am speaking with the correct person using two identifiers. ***  Insurance: Traditional [place] Medicaid  DME Supplier: [facility]  Pump Serial Number: [id number]  Infusion Set: TruSteel 6 mm cannula, 23 in tubing *Patient did receive Autosoft XC 6 mm cannula, 23 in tubing sites for first order; however, at prior training appt (11/22/22) assisted patient/guardian with contacting [facility] to exchange with TruSteel infusion set sites. Patient/guardian was instructed to mail back Autosoft sites to receive TruSteel sites. Provided patient with 9 TruSteel sites to use in the interim time frame until he receives replacement. Stressed that grandfather sent back autosoft XC infusion set sites as quickly as possible.   Tandem T:Slim X2 with Control IQ Technology Insulin Pump Settings     Time Basal (Max Basal:  3 units/hr) Correction Factor Carb Ratio (Max Bolus: 15 units) Target BG  12AM 1.58 30 10 110  2AM 1.58 30 10 110  8AM 1.58 25 8 110  4PM 1.58 25 8 110  10PM 1.58 25 8  110               Total:   37.92 units            Control IQ -Weight: 50 kg -TDD: 64 units   Sleep Activity -Daily: 11PM-8AM  Tconnect Account [email or web address]  Infusion Set Sites *** -Patient-reports infusion set sites are abdomen  --Patient/guardian reports patient is independently doing infusion set site changes --Patient/guardian reports patient is rotating infusion set sites --Patient/guardian denies patient experiences infusion set failures  Diet *** Patient/guardian reports patient's dietary habits:  Breakfast (8am-12pm; typically falls asleep to nap after): PB&J Lunch(4pm): PB&J, McDonalds, pizza Dinner (10pm): PB&J, [employer]onal[employer], pizza Snacks (2am): cinnamon toast crunch  Exercise *** Patient/guardian reports patient's exercise habits: walking    O:   Labs:   Dexcom Clarity CGM Report  ***   Tconnect and Tandem Source Reports ***  There were no vitals filed for this visit.  HbA1c Lab Results  Component Value Date   HGBA1C >14 10/26/2022   HGBA1C >14 04/14/2022   HGBA1C >14.0 (H) 01/16/2022    Pancreatic Islet Cell Autoantibodies Lab Results  Component Value Date   ISLETAB <5 07/21/2016    Insulin Autoantibodies Lab Results  Component Value Date   INSULINAB 4.6 (H) 07/21/2016    Glutamic Acid Decarboxylase Autoantibodies Lab Results  Component Value Date   GLUTAMICACAB <5 07/21/2016    ZnT8 Autoantibodies No results found for: "ZNT8AB"  IA-2 Autoantibodies No results found for: "LABIA2"  C-Peptide Lab Results  Component Value Date   CPEPTIDE <0.10 (L) 11/13/2017    Microalbumin Lab  Results  Component Value Date   MICRALBCREAT 19 01/16/2022    Lipids    Component Value Date/Time   CHOL 207 (H) 01/16/2022 1431   TRIG 57 01/16/2022 1431   HDL 72 01/16/2022 1431   CHOLHDL 2.9 01/16/2022 1431   LDLCALC 121 (H) 01/16/2022 1431    Assessment: TIR is not at goal > 70%. No hypoglycemia. TDD is ~*** units; ***% basal, ***% bolus.  ***. Continue wearing Dexcom G7 CGM and insulin pump. Follow up ***.  Plan: Insulin pump settings:   ***  Education: *** Monitoring:  Continue wearing Dexcom G7 CGM Keith [name] has a diagnosis of diabetes, checks blood glucose readings > 4x per day, wears an insulin pump, and requires frequent adjustments to insulin regimen. This patient will be seen every six months, minimally, to assess adherence to their CGM regimen and diabetes treatment plan. Follow Up: ***   This appointment required *** minutes of patient care (this includes precharting, chart review, review of results, virtual care, etc.).  Time Spent 11/17/22-12/17/22: 30 *** minutes  -11/24/22: 30 minutes (billed [id number]) -12/08/22: *** minutes (billed ***)  Thank you for involving clinical pharmacist/diabetes educator to assist in providing this patient's care.  Drexel Iha, PharmD, BCACP, [place], CPP

## 2022-12-08 ENCOUNTER — Telehealth (INDEPENDENT_AMBULATORY_CARE_PROVIDER_SITE_OTHER): Payer: Self-pay | Admitting: Pharmacist

## 2022-12-08 NOTE — Telephone Encounter (Signed)
Called patient on 12/08/2022 at 11:41 AM and left HIPAA-compliant voicemail with instructions to call [facility] back.  Plan to discuss pump follow up appointment scheduled today 12/08/22 11:30 am .   Thank you for involving pharmacy/diabetes educator to assist in providing this patient's care.   Drexel Iha, PharmD, BCACP, [place], CPP

## 2023-01-01 ENCOUNTER — Telehealth (INDEPENDENT_AMBULATORY_CARE_PROVIDER_SITE_OTHER): Payer: Self-pay | Admitting: Pediatric Endocrinology

## 2023-01-01 NOTE — Telephone Encounter (Signed)
  Name of who is calling:[place]   Caller's Relationship to Patient:Grandfather   Best contact number:[phone]  Provider they see:Dr. Vanessa Sinton   Reason for call:patients grandfather called to let Dr. Vanessa [place] know that he was admitted into the hospital and asked for a call back to see if they needed to move up the appointment or keep everything that same.      PRESCRIPTION REFILL ONLY  Name of prescription:  Pharmacy:

## 2023-01-02 NOTE — Telephone Encounter (Signed)
Attempted to call, left HIPAA approved voicemail for return phone call.  

## 2023-01-03 ENCOUNTER — Telehealth (INDEPENDENT_AMBULATORY_CARE_PROVIDER_SITE_OTHER): Payer: Self-pay | Admitting: Pediatric Endocrinology

## 2023-01-03 NOTE — Telephone Encounter (Signed)
See telephone encounter from 01/01/23

## 2023-01-03 NOTE — Telephone Encounter (Signed)
  Name of who is calling: Darius Bump Relationship to Patient: papa  Best contact number: [phone]  Provider they see: Vanessa Fallbrook  Reason for call: His grandfather returning missed call from yesterday, please follow up     PRESCRIPTION REFILL ONLY  Name of prescription:  Pharmacy:

## 2023-01-03 NOTE — Telephone Encounter (Signed)
Attempted to return call and left HIPAA approved

## 2023-01-24 ENCOUNTER — Ambulatory Visit (INDEPENDENT_AMBULATORY_CARE_PROVIDER_SITE_OTHER): Payer: Medicaid Other | Admitting: Pediatric Endocrinology

## 2023-01-25 ENCOUNTER — Encounter (INDEPENDENT_AMBULATORY_CARE_PROVIDER_SITE_OTHER): Payer: Self-pay | Admitting: Pediatric Endocrinology

## 2023-01-25 ENCOUNTER — Ambulatory Visit (INDEPENDENT_AMBULATORY_CARE_PROVIDER_SITE_OTHER): Payer: Medicaid Other | Admitting: Pediatric Endocrinology

## 2023-01-25 VITALS — BP 120/72 | HR 68 | Wt 103.4 lb

## 2023-01-25 DIAGNOSIS — H00015 Hordeolum externum left lower eyelid: Secondary | ICD-10-CM

## 2023-01-25 DIAGNOSIS — E1065 Type 1 diabetes mellitus with hyperglycemia: Secondary | ICD-10-CM

## 2023-01-25 DIAGNOSIS — Z91199 Patient's noncompliance with other medical treatment and regimen due to unspecified reason: Secondary | ICD-10-CM

## 2023-01-25 LAB — POCT GLUCOSE (DEVICE FOR HOME USE): POC Glucose: HIGH mg/dl (ref 70–99)

## 2023-01-25 LAB — POCT GLYCOSYLATED HEMOGLOBIN (HGB A1C): HbA1c POC (<> result, manual entry): <14 % (ref 4.0–5.6)

## 2023-01-25 MED ORDER — ERYTHROMYCIN 5 MG/GM OP OINT: 1.0000 | TOPICAL_OINTMENT | Freq: Every day | OPHTHALMIC | 0 refills | Status: AC

## 2023-01-25 NOTE — Patient Instructions (Signed)
If eye is not better by Monday please see another provider (primary care or urgent care or eye doctor)
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
## 2023-01-29 NOTE — Progress Notes (Deleted)
This is a Pediatric Specialist E-Visit (My Chart Video Visit) follow up consult provided via WebEx Keith [name] and Keith [name] (grandfather) consented to an E-Visit consult today  Location of patient: Keith [name] and Keith [name] (grandfather) are at home  Location of provider: Zachery Conch, PharmD, BCACP, CDCES, CPP is working remotely   S:     No chief complaint on file.   Endocrinology provider: Dr. Vanessa Schubert  Patient referred to me for insulin pump initiation and training. PMH significant for T1DM (dx 08/03/15), goiter, peripheral neuropathy, hx of self harm (self-cutting), noncompliance with diabetes treatment. Patient wears a t:slim X2 insulin pump with control IQ technology and Dexcom G7 CGM. Patient was started on the t:slim X2 insulin pump with control IQ technology insulin pump on 11/22/22.   I connected with Keith [name] and grandfather, Keith [name], on 01/29/23 *** by video and verified that I am speaking with the correct person using two identifiers. ***  Insurance: Traditional [place] Medicaid  DME Supplier: [facility]  Pump Serial Number: [id number]  Infusion Set: TruSteel 6 mm cannula, 23 in tubing *Patient did receive [employer]oft XC 6 mm cannula, 23 in tubing sites for first order; however, at prior training appt (11/22/22) assisted patient/guardian with contacting [employer] to exchange with TruSteel infusion set sites. Patient/guardian was instructed to mail back Autosoft sites to receive TruSteel sites. Provided patient with 9 TruSteel sites to use in the interim time frame until he receives replacement. Stressed that grandfather sent back autosoft XC infusion set sites as quickly as possible.   Tandem T:Slim X2 with Control IQ Technology Insulin Pump Settings     Time Basal (Max Basal:  3 units/hr) Correction Factor Carb Ratio (Max Bolus: 15 units) Target BG  12AM 1.58 30 10 110  2AM 1.58 30 10 110  8AM 1.58 25 8 110  4PM 1.58 25 8 110  10PM 1.58 25 8  110                Total:  37.92 units            Control IQ -Weight: 50 kg -TDD: 64 units   Sleep Activity -Daily: 11PM-8AM  Tconnect Account [email or web address]  Infusion Set Sites (*** changes since prior appt on 11/24/22) -Patient-reports infusion set sites are abdomen  --Patient/guardian reports patient is independently doing infusion set site changes --Patient/guardian reports patient is rotating infusion set sites --Patient/guardian denies patient experiences infusion set failures  Diet (*** changes since prior appt on 11/24/22) Patient/guardian reports patient's dietary habits:  Breakfast (8am-12pm; typically falls asleep to nap after): PB&J Lunch(4pm): PB&J, McDonal[employer], pizza Dinner (10pm): PB&J, [employer], pizza Snacks (2am): cinnamon toast crunch  Exercise (*** changes since prior appt on 11/24/22) Patient/guardian reports patient's exercise habits: walking    O:   Labs:   Tandem Source Report - last upload 01/25/23 [facility] #[id number]))    Tandem Source Report - last upload 11/13/19 (SN [phone]))   There were no vitals filed for this visit.  HbA1c Lab Results  Component Value Date   HGBA1C <14 01/25/2023   HGBA1C >14 10/26/2022   HGBA1C >14 04/14/2022    Pancreatic Islet Cell Autoantibodies Lab Results  Component Value Date   ISLETAB <5 07/21/2016    Insulin Autoantibodies Lab Results  Component Value Date   INSULINAB 4.6 (H) 07/21/2016    Glutamic Acid Decarboxylase Autoantibodies Lab Results  Component Value Date   GLUTAMICACAB <5 07/21/2016    ZnT8 Autoantibodies No results found for: "ZNT8AB"  IA-2 Autoantibodies No results found for: "LABIA2"  C-Peptide Lab Results  Component Value Date   CPEPTIDE <0.10 (L) 11/13/2017    Microalbumin Lab Results  Component Value Date   MICRALBCREAT 19 01/16/2022    Lipids    Component Value Date/Time   CHOL 207 (H) 01/16/2022 1431   TRIG 57 01/16/2022 1431   HDL 72 01/16/2022 1431   CHOLHDL  2.9 01/16/2022 1431   LDLCALC 121 (H) 01/16/2022 1431    Assessment: TIR is *** goal > 70%. *** hypoglycemia. ***. Continue wearing Dexcom G7 CGM and Tandem t:slim X2 with control IQ technology insulin pump. Follow up ***.  Plan: Insulin pump settings: Education: Stressed importance of changing infusion set sites in a timely manner and that he should not go longer than 1 hour without insulin due to risk of DKA. Patient and grandfather verbalized understanding. Also reminded patient to keep Tconnect and Dexcom G7 CGM apps open on phone.  Monitoring:  Continue wearing Dexcom G7 CGM Keith [name] has a diagnosis of diabetes, checks blood glucose readings > 4x per day, wears an insulin pump, and requires frequent adjustments to insulin regimen. This patient will be seen every six months, minimally, to assess adherence to their CGM regimen and diabetes treatment plan. Follow Up: ***   This appointment required *** minutes of patient care (this includes precharting, chart review, review of results, virtual care, etc.).  Time Spent 01/17/23-02/16/23: *** minutes  -01/30/23: *** minutes  Thank you for involving clinical pharmacist/diabetes educator to assist in providing this patient's care.  Zachery Conch, PharmD, BCACP, CDCES, CPP

## 2023-01-30 ENCOUNTER — Encounter (INDEPENDENT_AMBULATORY_CARE_PROVIDER_SITE_OTHER): Payer: Self-pay | Admitting: Pharmacist

## 2023-01-30 ENCOUNTER — Telehealth (INDEPENDENT_AMBULATORY_CARE_PROVIDER_SITE_OTHER): Payer: Self-pay | Admitting: Pharmacist

## 2023-01-30 ENCOUNTER — Telehealth (INDEPENDENT_AMBULATORY_CARE_PROVIDER_SITE_OTHER): Payer: Medicaid Other | Admitting: Pharmacist

## 2023-01-30 NOTE — Telephone Encounter (Signed)
Called [phone] (phone number in appointment notes) as patient did not join link for virtual appointment. Grandfather, Mr. Bayard Hugger, answered and was unaware of this appointment. He provided me with Helton's cell phone number [phone] to contact him regarding appointment.  Called patient at [phone] on 01/30/2023 at 2:58 PM. Unable to leave HIPAA-compliant voicemail with instructions to call [facility] back as voicemail box full.  Called [phone] to contact [name] and inform him I was unable to get in touch with Maximilliano. Advised [name] I will message Lavin to instruct him to reschedule appointment, but if he sees/talks with Janeal Holmes soon to recommend him to contact the office back to reschedule appointment.  Thank you for involving pharmacy/diabetes educator to assist in providing this patient's care.   Zachery Conch, PharmD, BCACP, CDCES, CPP

## 2023-02-21 ENCOUNTER — Ambulatory Visit: Payer: Medicaid Other | Admitting: Podiatry

## 2023-03-12 ENCOUNTER — Telehealth (INDEPENDENT_AMBULATORY_CARE_PROVIDER_SITE_OTHER): Payer: Self-pay | Admitting: Pediatric Endocrinology

## 2023-03-12 NOTE — Telephone Encounter (Signed)
Spoke to stephanie. She wanted to get pts settings for his pump as they had to transition him back onto pump. Only pump setttings I found were from the 11/24/22 visit with [facility]. I printed this out and faxed it to stephanie. She had no further concerns.

## 2023-03-12 NOTE — Telephone Encounter (Signed)
    Name of who is calling: Armond Hang  Caller's Relationship to Patient: Other- [facility]  Best contact number: [phone]  Provider they see: Dr. Vanessa Jayuya  Reason for call: Judeth Cornfield calling from the pharmacy needing dexcom settings.  [phone]- p [phone]- fax     PRESCRIPTION REFILL ONLY  Name of prescription:  Pharmacy:

## 2023-03-14 ENCOUNTER — Telehealth (INDEPENDENT_AMBULATORY_CARE_PROVIDER_SITE_OTHER): Payer: Self-pay | Admitting: Pharmacist

## 2023-03-14 NOTE — Telephone Encounter (Signed)
Received a call from a pharmacist, Judeth Cornfield, at [facility]. Patient has been admitted for DKA and inpatient team wanted to clarify insulin demands.  Verified with Blanchard Kelch pump settings. Explained patient's insulin demands and social history.   Provided her patient's pump settings, my prior office visit note (to share with [facility] team regarding insulin demands), and pump failure plan.   She was appreciative of the call.  Thank you for involving clinical pharmacist/diabetes educator to assist in providing this patient's care.   Zachery Conch, PharmD, BCACP, CDCES, CPP

## 2023-03-20 ENCOUNTER — Ambulatory Visit (INDEPENDENT_AMBULATORY_CARE_PROVIDER_SITE_OTHER): Payer: Medicaid Other | Admitting: Pediatric Endocrinology

## 2023-03-23 ENCOUNTER — Encounter (INDEPENDENT_AMBULATORY_CARE_PROVIDER_SITE_OTHER): Payer: Self-pay

## 2023-04-05 ENCOUNTER — Encounter (INDEPENDENT_AMBULATORY_CARE_PROVIDER_SITE_OTHER): Payer: Self-pay

## 2023-06-02 ENCOUNTER — Other Ambulatory Visit (INDEPENDENT_AMBULATORY_CARE_PROVIDER_SITE_OTHER): Payer: Self-pay | Admitting: Pediatric Endocrinology

## 2023-06-02 DIAGNOSIS — E1065 Type 1 diabetes mellitus with hyperglycemia: Secondary | ICD-10-CM

## 2023-07-03 ENCOUNTER — Telehealth (INDEPENDENT_AMBULATORY_CARE_PROVIDER_SITE_OTHER): Payer: Self-pay | Admitting: Pediatrics

## 2023-07-03 DIAGNOSIS — E1065 Type 1 diabetes mellitus with hyperglycemia: Secondary | ICD-10-CM

## 2023-07-03 NOTE — Telephone Encounter (Signed)
  Name of who is calling: Bayard Hugger  Caller's Relationship to Patient: Emelia Loron  Best contact number: [phone]  Provider they see: Vanessa [place]  Reason for call: Patient's grandfather called requesting a refill on the patient's G7 until they can get in with an adult primary care.

## 2023-07-06 MED ORDER — DEXCOM G7 SENSOR MISC
1.0000 | 2 refills | Status: DC
Start: 2023-07-06 — End: 2024-03-17

## 2023-08-31 ENCOUNTER — Telehealth (INDEPENDENT_AMBULATORY_CARE_PROVIDER_SITE_OTHER): Payer: Self-pay

## 2023-08-31 NOTE — Telephone Encounter (Signed)
Received fax from [facility], regarding a PA. Sent the ppw to on call provider to send in refills. Per Dr. Quincy Sheehan pt needs appointment in order to receive refills

## 2023-09-21 IMAGING — CT CT ABD-PELV W/ CM
2 of 4 series · 16 of 46 positions shown, 18 images · IV contrast (agent unspecified)
Comparison: 06/05/2017 CT

CLINICAL DATA: 20-year-old male with acute RIGHT abdominal and
pelvic pain with nausea and vomiting for 2 days.

EXAM:
CT ABDOMEN AND PELVIS WITH CONTRAST
TECHNIQUE: Multidetector CT imaging of the abdomen and pelvis was performed
using the standard protocol following bolus administration of
intravenous contrast.

[Series 3: a/p w/ 5mm · axial · 0.58mm/px · z∈[-1006,-606]mm · 13 of 88 slices shown, 15 images]
[im 4/88  soft-tissue]
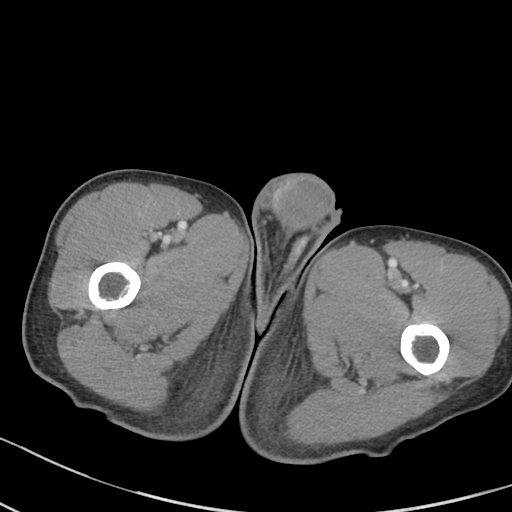
[im 4/88  bone]
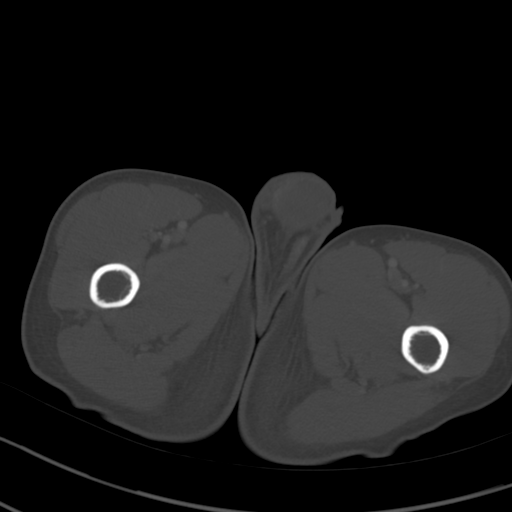
[im 11/88  soft-tissue]
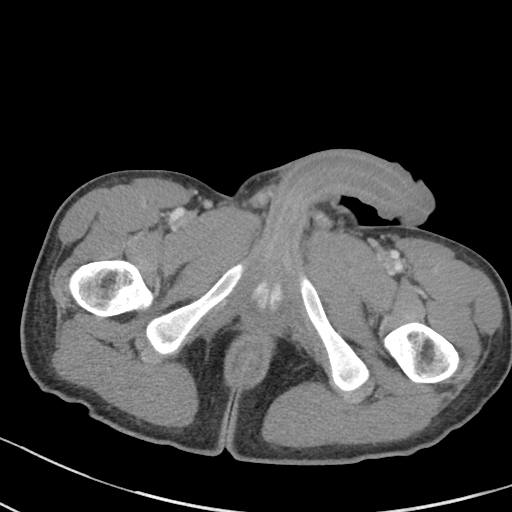
[im 17/88  soft-tissue]
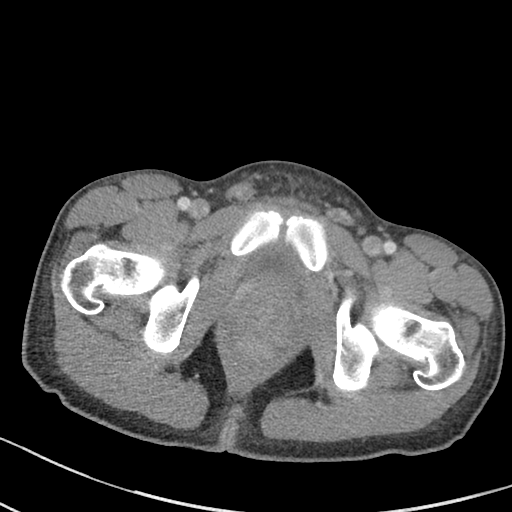
[im 24/88  soft-tissue]
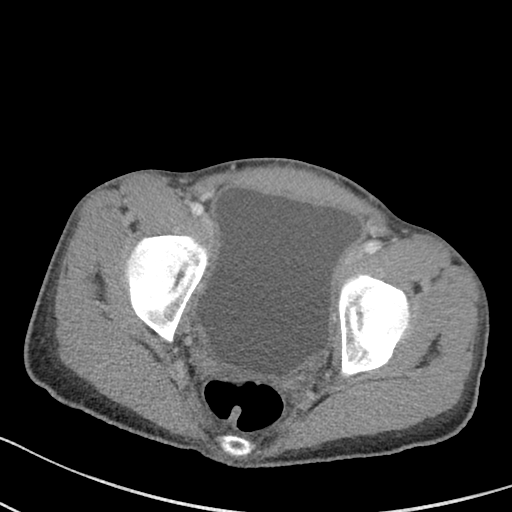
[im 31/88  soft-tissue]
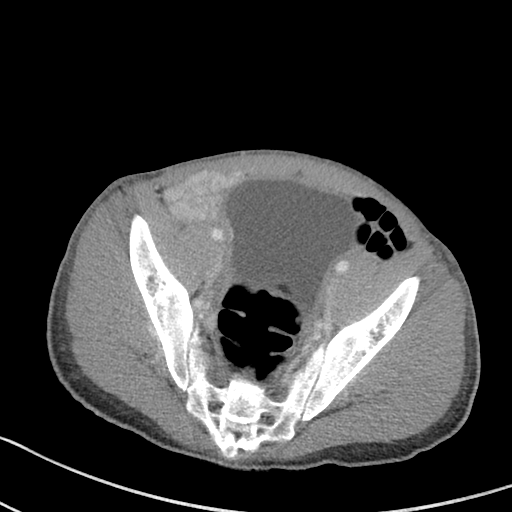
[im 37/88  soft-tissue]
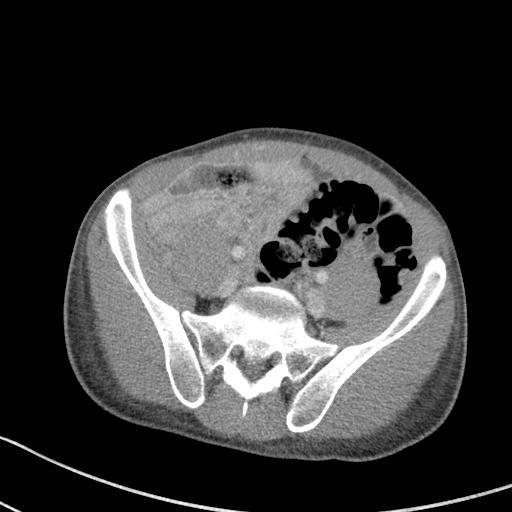
[im 44/88  soft-tissue]
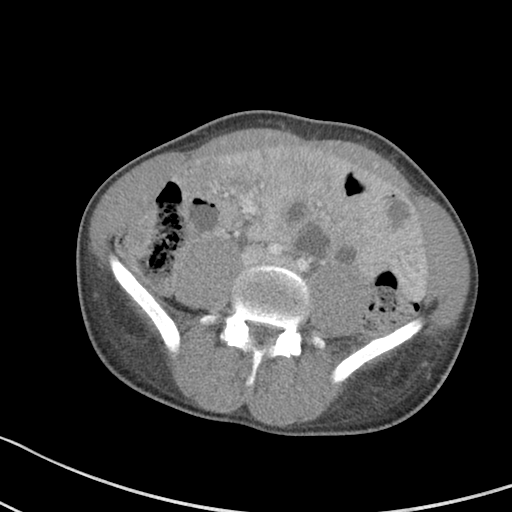
[im 51/88  soft-tissue]
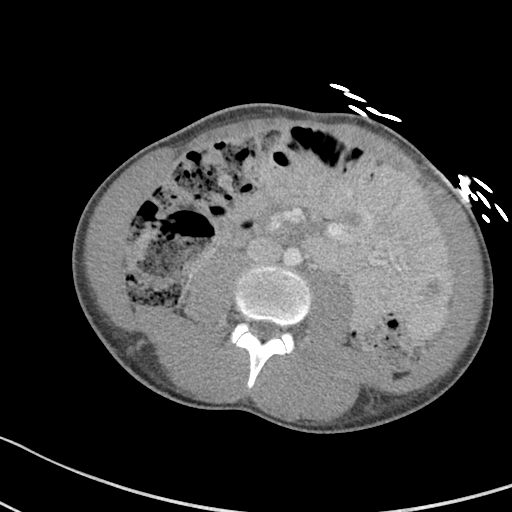
[im 57/88  soft-tissue]
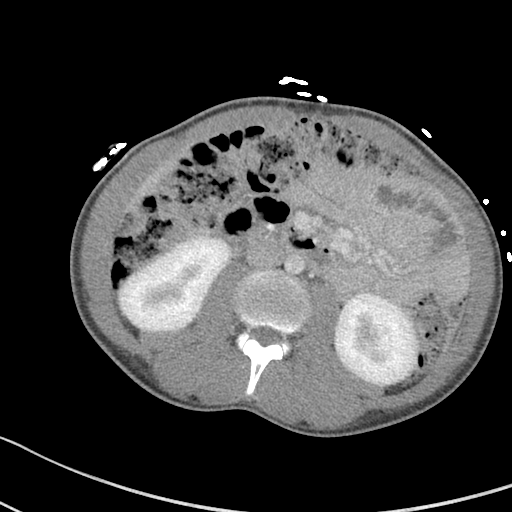
[im 57/88  bone]
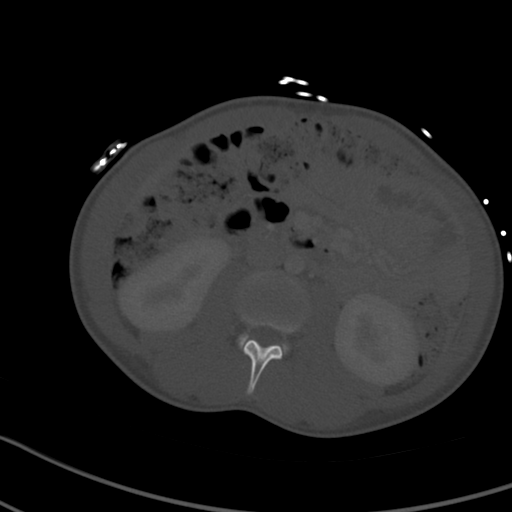
[im 64/88  soft-tissue]
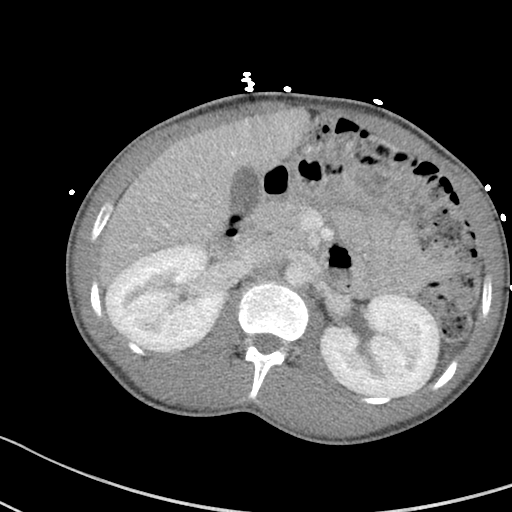
[im 71/88  soft-tissue]
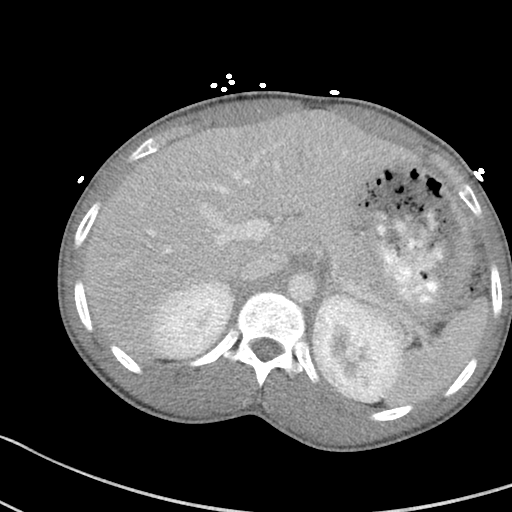
[im 77/88  soft-tissue]
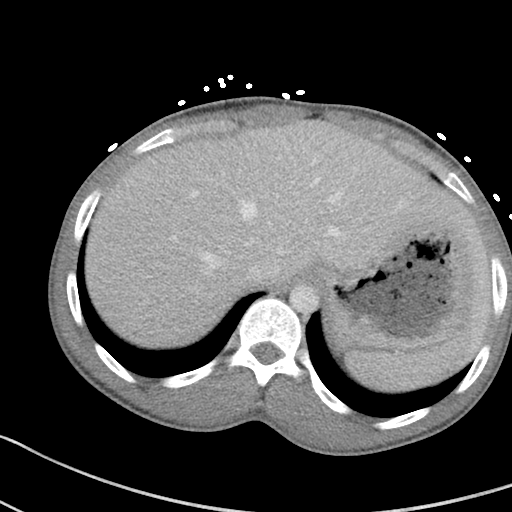
[im 84/88  soft-tissue]
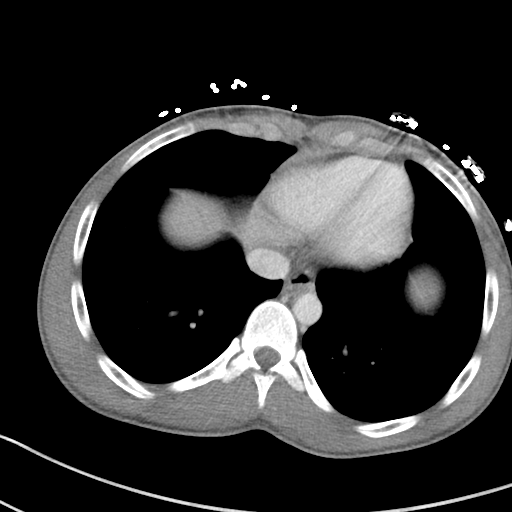

[Series 6: a/p w/ cor · coronal · 0.69mm/px · 3 of 107 slices shown]
[im 36/107  soft-tissue]
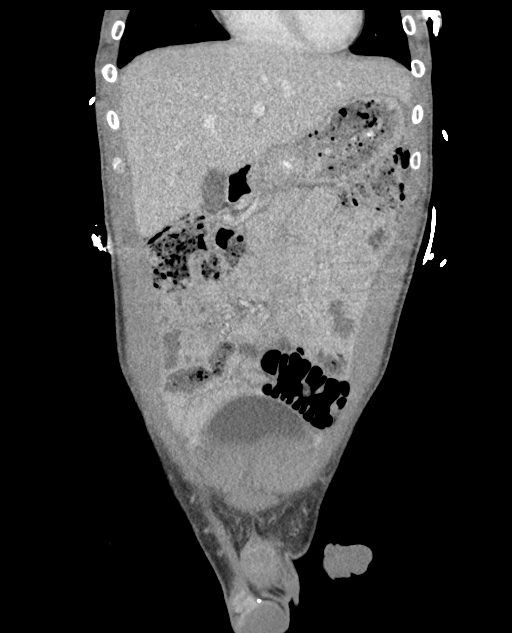
[im 48/107  soft-tissue]
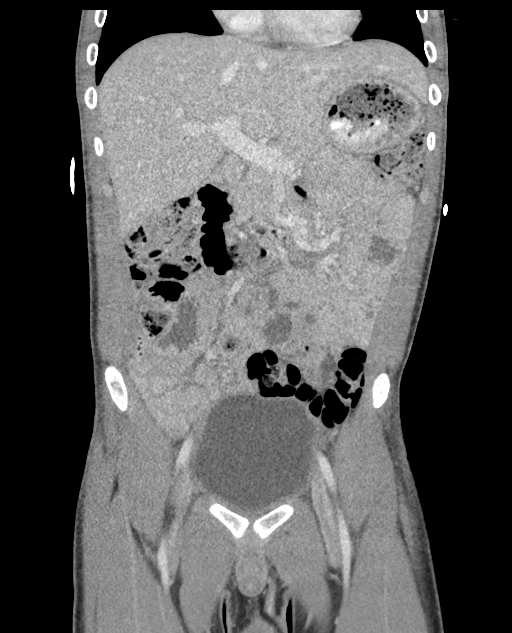
[im 59/107  soft-tissue]
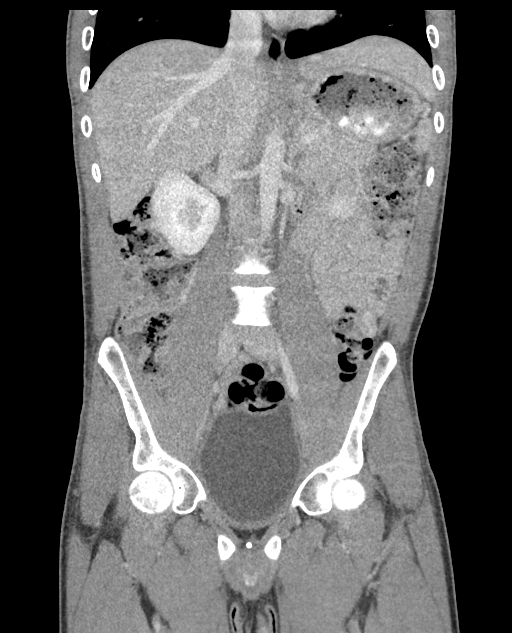

[16 of 46 positions shown; findings below may reference images not displayed]

RADIATION DOSE REDUCTION: This exam was performed according to the
departmental dose-optimization program which includes automated
exposure control, adjustment of the mA and/or kV according to
patient size and/or use of iterative reconstruction technique.

CONTRAST:  75mL OMNIPAQUE IOHEXOL 300 MG/ML  SOLN
FINDINGS: Lower chest: Unremarkable

Hepatobiliary: The liver and gallbladder are unremarkable. There is
no evidence of intrahepatic or extrahepatic biliary dilatation.

Pancreas: Unremarkable

Spleen: Unremarkable

Adrenals/Urinary Tract: The kidneys, adrenal glands and bladder are
unremarkable.

Stomach/Bowel: Stomach is within normal limits. The appendix is not
identified due to paucity of fat, but there are no findings to
suggest appendicitis. No evidence of bowel wall thickening,
distention, or inflammatory changes.

Vascular/Lymphatic: No significant vascular findings are present. No
enlarged abdominal or pelvic lymph nodes.

Reproductive: Prostate is unremarkable.

Other: No ascites, focal collection or pneumoperitoneum.

Musculoskeletal: No acute or suspicious bony abnormalities are
noted.
IMPRESSION: No evidence of acute or significant abnormality. The appendix is not
identified but there are no findings to suggest appendicitis.

## 2023-10-12 ENCOUNTER — Other Ambulatory Visit (INDEPENDENT_AMBULATORY_CARE_PROVIDER_SITE_OTHER): Payer: Self-pay | Admitting: Pediatric Endocrinology

## 2023-10-12 DIAGNOSIS — E1065 Type 1 diabetes mellitus with hyperglycemia: Secondary | ICD-10-CM

## 2023-10-24 ENCOUNTER — Other Ambulatory Visit (INDEPENDENT_AMBULATORY_CARE_PROVIDER_SITE_OTHER): Payer: Self-pay | Admitting: Family

## 2023-10-24 DIAGNOSIS — E1065 Type 1 diabetes mellitus with hyperglycemia: Secondary | ICD-10-CM

## 2023-12-25 ENCOUNTER — Encounter (INDEPENDENT_AMBULATORY_CARE_PROVIDER_SITE_OTHER): Payer: Self-pay

## 2024-01-07 ENCOUNTER — Encounter (INDEPENDENT_AMBULATORY_CARE_PROVIDER_SITE_OTHER): Payer: Self-pay

## 2024-03-03 ENCOUNTER — Encounter: Payer: MEDICAID | Attending: "Endocrinology | Admitting: Nutrition

## 2024-03-03 ENCOUNTER — Ambulatory Visit (INDEPENDENT_AMBULATORY_CARE_PROVIDER_SITE_OTHER): Payer: MEDICAID | Admitting: "Endocrinology

## 2024-03-03 ENCOUNTER — Encounter: Payer: Self-pay | Admitting: "Endocrinology

## 2024-03-03 VITALS — BP 100/80 | HR 85 | Ht 63.0 in | Wt 93.0 lb

## 2024-03-03 DIAGNOSIS — E7800 Pure hypercholesterolemia, unspecified: Secondary | ICD-10-CM | POA: Diagnosis not present

## 2024-03-03 DIAGNOSIS — E1065 Type 1 diabetes mellitus with hyperglycemia: Secondary | ICD-10-CM

## 2024-03-03 DIAGNOSIS — E1042 Type 1 diabetes mellitus with diabetic polyneuropathy: Secondary | ICD-10-CM | POA: Insufficient documentation

## 2024-03-03 DIAGNOSIS — G629 Polyneuropathy, unspecified: Secondary | ICD-10-CM

## 2024-03-03 NOTE — Progress Notes (Signed)
 Outpatient Endocrinology Note Keith Newcomer, MD  03/03/24   Carolan Chuck 2001-09-12 [id number]  Referring Provider: Candee Cha, NP Primary Care Provider: Kingston Penta, DO Reason for consultation: Subjective   Assessment & Plan  Diagnoses and all orders for this visit:  Uncontrolled type 1 diabetes mellitus with hyperglycemia (HCC)  Neuropathy  Pure hypercholesterolemia    Diabetes Type I complicated by undetectably high blood sugars, No results found for: GFR Hba1c goal less than 7, current Hba1c is  Lab Results  Component Value Date   HGBA1C <14 01/25/2023   Will recommend the following: Continue current regimen, patient wants to resume T-Slim pump,  Started diabetes education today to assess if the patient understands his pump well enough to resume it Patient  will come back tomorrow to DM educator to start it since already took lantus  today   Discussed the importance of correct carb counting Patient is currently taking 40 units of Lantus  nightly with NovoLog  with carb ratio 1:10 with correction of blood sugar divided by 12 Reports taking NovoLog  6 units this morning for [employer] meal leading to blood sugars undetectably high afterwards  Patient previously on pump and supposedly was doing well at one point on pump.  His A1c was undetectably high, more than 14 in 11/2023 Will make changes based on accurate blood sugar data based off of Dexcom sensor.  Today, currently Unable to make any changes currently given lack of recent blood sugar data on BG meter and CGM data is old   No known contraindications/side effects to any of above medications  -Last LD and Tg are as follows: Lab Results  Component Value Date   LDLCALC 121 (H) 01/16/2022    Lab Results  Component Value Date   TRIG 57 01/16/2022   -not on statin  -Follow low fat diet and exercise   -Blood pressure goal <140/90 - Microalbumin/creatinine goal is < 30 -Last MA/Cr is as  follows: Lab Results  Component Value Date   MICROALBUR 0.8 01/16/2022   -not on ACE/ARB  -diet changes including salt restriction -limit eating outside -counseled BP targets per standards of diabetes care -uncontrolled blood pressure can lead to retinopathy, nephropathy and cardiovascular and atherosclerotic heart disease  Reviewed and counseled on: -A1C target -Blood sugar targets -Complications of uncontrolled diabetes  -Checking blood sugar before meals and bedtime and bring log next visit -All medications with mechanism of action and side effects -Hypoglycemia management: rule of 15's, Glucagon  Emergency Kit and medical alert ID -low-carb low-fat plate-method diet -At least 20 minutes of physical activity per day -Annual dilated retinal eye exam and foot exam -compliance and follow up needs -follow up as scheduled or earlier if problem gets worse  Call if blood sugar is less than 70 or consistently above 250    Take a 15 gm snack of carbohydrate at bedtime before you go to sleep if your blood sugar is less than 100.    If you are going to fast after midnight for a test or procedure, ask your physician for instructions on how to reduce/decrease your insulin  dose.    Call if blood sugar is less than 70 or consistently above 250  -Treating a low sugar by rule of 15  (15 gms of sugar every 15 min until sugar is more than 70) If you feel your sugar is low, test your sugar to be sure If your sugar is low (less than 70), then take 15 grams of a fast acting Carbohydrate (  3-4 glucose tablets or glucose gel or 4 ounces of juice or regular soda) Recheck your sugar 15 min after treating low to make sure it is more than 70 If sugar is still less than 70, treat again with 15 grams of carbohydrate          Don't drive the hour of hypoglycemia  If unconscious/unable to eat or drink by mouth, use glucagon  injection or nasal spray baqsimi  and call 911. Can repeat again in 15 min if still  unconscious.  Return in about 2 weeks (around 03/17/2024).   I have reviewed current medications, nurse's notes, allergies, vital signs, past medical and surgical history, family medical history, and social history for this encounter. Counseled patient on symptoms, examination findings, lab findings, imaging results, treatment decisions and monitoring and prognosis. The patient understood the recommendations and agrees with the treatment plan. All questions regarding treatment plan were fully answered.  Keith Newcomer, MD  03/03/24    History of Present Illness Keith [name] is a 23 y.o. year old male who presents for evaluation of Type I diabetes mellitus.  Candice Lunney was first diagnosed at age 26.   Diabetes education +  Home diabetes regimen: Lantus  40 units at bedtime Novolog  IC 1:10 and ISF BG/12 Admitted 12/10/23 for DKA and taken off of insulin  pump   COMPLICATIONS -  MI/Stroke -  retinopathy +  neuropathy -  nephropathy  SYMPTOMS REVIEWED - Polyuria + Weight loss + Blurred vision  BLOOD SUGAR DATA Cgm reviewed, report is from 2024 with elevated BG BG meter reviewed, has scant intermittent data with high BG   Physical Exam  BP 100/80   Pulse 85   Ht 5' 3 (1.6 m)   Wt 93 lb (42.2 kg)   SpO2 98%   BMI 16.47 kg/m    Constitutional: well developed, well nourished Head: normocephalic, atraumatic Eyes: sclera anicteric, no redness Neck: supple Lungs: normal respiratory effort Neurology: alert and oriented Skin: dry, no appreciable rashes Musculoskeletal: no appreciable defects Psychiatric: normal mood and affect Diabetic Foot Exam - Simple   No data filed      Current Medications Patient's Medications  New Prescriptions   No medications on file  Previous Medications   ACCU-CHEK FASTCLIX LANCETS MISC    TEST 6 TIMES DAILY AS DIRECTED   ACCU-CHEK GUIDE TEST STRIP    TEST BLOOD SUGAR 8 TIMES DAILY   BD PEN NEEDLE NANO 2ND GEN 32G X 4 MM MISC     USE 1 NEEDLE 6 TIMES DAILY   BLOOD GLUCOSE MONITORING SUPPL (ACCU-CHEK GUIDE) W/DEVICE KIT    USE TO CHECK BLOOD GLUCOSE 8 TIMES DAILY   CONTINUOUS GLUCOSE SENSOR (DEXCOM G7 SENSOR) MISC    Inject 1 Device into the skin as directed. Change sensor every 10 days. Use to monitor glucose continuously.   ERYTHROMYCIN  OPHTHALMIC OINTMENT    Place 1 Application into the left eye at bedtime. If not improved in 5 days please seek additional medical advice.   GLUCAGON  (BAQSIMI  TWO PACK) 3 MG/DOSE POWD    Place 1 application  into the nose as needed. Use as directed if unconscious, unable to take food po, or having a seizure due to hypoglycemia   INSULIN  ASPART (NOVOLOG ) 100 UNIT/ML INJECTION    INJECT UP TO 300 UNITS UNDER THE SKIN EVERY 2 DAYS INTO INSULIN  PUMP   INSULIN  PEN NEEDLE 32G X 4 MM MISC    Use as directed   LANTUS  SOLOSTAR 100 UNIT/ML SOLOSTAR PEN  INJECT UP TO 50 UNITS UNDER THE SKIN PER DAY   NOVOLOG  FLEXPEN 100 UNIT/ML FLEXPEN    ADMINISTER UP TO 50 UNITS UNDER THE SKIN DAILY   TETRAHYDROZOLINE 0.05 % OPHTHALMIC SOLUTION    Using generic over the counter eye drops.   URINE GLUCOSE-KETONES TEST STRP    1 each by Other route as needed.  Modified Medications   No medications on file  Discontinued Medications   No medications on file    Allergies Allergies  Allergen Reactions   Amoxicillin  Hives and Rash   Penicillins Hives and Rash    Has patient had a PCN reaction causing immediate rash, facial/tongue/throat swelling, SOB or lightheadedness with hypotension: Yes Has patient had a PCN reaction causing severe rash involving mucus membranes or skin necrosis: Yes Has patient had a PCN reaction that required hospitalization: No Has patient had a PCN reaction occurring within the last 10 years: Yes If all of the above answers are NO, then may proceed with Cephalosporin use.     Past Medical History Past Medical History:  Diagnosis Date   Anxiety    Asthma    Auditory  hallucinations    Cannabis hyperemesis syndrome concurrent with and due to cannabis abuse (HCC)    Depression    Diabetes mellitus without complication (HCC) 08/03/2015   New onset   Obesity    PTSD (post-traumatic stress disorder)    Social problem 08/03/2015   Unintentional weight loss 06/07/2017    Past Surgical History Past Surgical History:  Procedure Laterality Date   FRACTURE SURGERY     Right arm    Family History family history includes Anxiety disorder in his mother; Cancer in his maternal grandfather and maternal grandmother; Diabetes in his mother; Heart disease in his maternal grandmother; Hyperlipidemia in his maternal grandfather; Hypothyroidism in his maternal grandmother.  Social History Social History   Socioeconomic History   Marital status: Single    Spouse name: Not on file   Number of children: Not on file   Years of education: Not on file   Highest education level: Not on file  Occupational History   Not on file  Tobacco Use   Smoking status: Never    Passive exposure: Past   Smokeless tobacco: Never  Substance and Sexual Activity   Alcohol use: No   Drug use: Yes    Types: Marijuana   Sexual activity: Never  Other Topics Concern   Not on file  Social History Narrative   Mom- bipolar- off her meds & non-compliant Type DM- refuses insulin  for self   Social Drivers of Health   Financial Resource Strain: Low Risk  (12/11/2023)   Received from [facility]   Overall Financial Resource Strain (CARDIA)    Difficulty of Paying Living Expenses: Not hard at all  Food Insecurity: Food Insecurity Present (12/12/2023)   Received from [facility]   Hunger Vital Sign    Within the past 12 months, you worried that your food would run out before you got the money to buy more.: Sometimes true    Within the past 12 months, the food you bought just didn't last and you didn't have money to get more.: Never true  Transportation Needs: No Transportation  Needs (12/11/2023)   Received from [facility]   PRAPARE - Transportation    Lack of Transportation (Medical): No    Lack of Transportation (Non-Medical): No  Physical Activity: Inactive (03/10/2023)   Received from [facility]  Care   Exercise Vital Sign    On average, how many days per week do you engage in moderate to strenuous exercise (like a brisk walk)?: 0 days    On average, how many minutes do you engage in exercise at this level?: 0 min  Stress: Stress Concern Present (07/07/2023)   Received from [facility] - Occupational Stress Questionnaire    Feeling of Stress : To some extent  Social Connections: Moderately Isolated (07/07/2023)   Received from [facility]   Social Connection and Isolation Panel    In a typical week, how many times do you talk on the phone with family, friends, or neighbors?: More than three times a week    How often do you get together with friends or relatives?: More than three times a week    How often do you attend church or religious services?: More than 4 times per year    Do you belong to any clubs or organizations such as church groups, unions, fraternal or athletic groups, or school groups?: No    How often do you attend meetings of the clubs or organizations you belong to?: Never    Are you married, widowed, divorced, separated, never married, or living with a partner?: Never married  Intimate Partner Violence: Not At Risk (12/11/2023)   Received from [facility]   Humiliation, Afraid, Rape, and Kick questionnaire    Within the last year, have you been afraid of your partner or ex-partner?: No    Within the last year, have you been humiliated or emotionally abused in other ways by your partner or ex-partner?: No    Within the last year, have you been kicked, hit, slapped, or otherwise physically hurt by your partner or ex-partner?: No    Within the last year, have you been raped or forced to  have any kind of sexual activity by your partner or ex-partner?: No    Lab Results  Component Value Date   HGBA1C <14 01/25/2023   HGBA1C >14 10/26/2022   HGBA1C >14 04/14/2022   Lab Results  Component Value Date   CHOL 207 (H) 01/16/2022   Lab Results  Component Value Date   HDL 72 01/16/2022   Lab Results  Component Value Date   LDLCALC 121 (H) 01/16/2022   Lab Results  Component Value Date   TRIG 57 01/16/2022   Lab Results  Component Value Date   CHOLHDL 2.9 01/16/2022   Lab Results  Component Value Date   CREATININE 0.71 01/16/2022   No results found for: GFR Lab Results  Component Value Date   MICROALBUR 0.8 01/16/2022      Component Value Date/Time   NA 137 01/16/2022 1431   K 4.4 01/16/2022 1431   CL 100 01/16/2022 1431   CO2 23 01/16/2022 1431   GLUCOSE 272 (H) 01/16/2022 1431   BUN 11 01/16/2022 1431   CREATININE 0.71 01/16/2022 1431   CALCIUM 10.0 01/16/2022 1431   PROT 7.1 01/16/2022 1431   ALBUMIN 3.5 11/06/2021 1136   AST 10 (L) 01/16/2022 1431   ALT 11 01/16/2022 1431   ALKPHOS 69 11/06/2021 1136   BILITOT 0.8 01/16/2022 1431   GFRNONAA >60 11/08/2021 0759   GFRAA NOT CALCULATED 05/16/2020 1216      Latest Ref Rng & Units 01/16/2022    2:31 PM 11/08/2021    7:59 AM 11/08/2021    3:25 AM  BMP  Glucose  65 - 99 mg/dL 161  096  045   BUN 7 - 20 mg/dL 11  5  9    Creatinine 0.60 - 1.24 mg/dL 4.09  8.11  9.14   BUN/Creat Ratio 6 - 22 (calc) NOT APPLICABLE     Sodium 135 - 146 mmol/L 137  138  137   Potassium 3.8 - 5.1 mmol/L 4.4  3.6  3.2   Chloride 98 - 110 mmol/L 100  101  100   CO2 20 - 32 mmol/L 23  30  31    Calcium 8.9 - 10.4 mg/dL 78.2  8.2  8.5        Component Value Date/Time   WBC 5.5 04/14/2022 1059   RBC 5.46 04/14/2022 1059   HGB 14.5 04/14/2022 1059   HCT 45.2 04/14/2022 1059   PLT 344 04/14/2022 1059   MCV 82.8 04/14/2022 1059   MCH 26.6 (L) 04/14/2022 1059   MCHC 32.1 04/14/2022 1059   RDW 12.7 04/14/2022 1059    LYMPHSABS 1,870 04/14/2022 1059   MONOABS 1.3 (H) 04/29/2021 1710   EOSABS 22 04/14/2022 1059   BASOSABS 61 04/14/2022 1059     Parts of this note may have been dictated using voice recognition software. There may be variances in spelling and vocabulary which are unintentional. Not all errors are proofread. Please notify the [facility] if any discrepancies are noted or if the meaning of any statement is not clear.

## 2024-03-03 NOTE — Patient Instructions (Signed)

## 2024-03-04 ENCOUNTER — Ambulatory Visit: Payer: MEDICAID | Admitting: Nutrition

## 2024-03-04 ENCOUNTER — Telehealth: Payer: Self-pay | Admitting: Nutrition

## 2024-03-04 NOTE — Telephone Encounter (Signed)
 Spoike with grandfather who was with him yesterday.  He did not show for his appointment this afternoon. Grandfather did not know this    Dexcom readings are high.  Patient is not home. Appointment rescheduled for tomorrow afternoon.

## 2024-03-04 NOTE — Progress Notes (Signed)
 Patien is here with his grandfather wanting to restart his Tandem  Control IQ insulin  pump.  He is currently wearing his G7 sensor.  He reviewed with me how to give a bolus, need to cover all meals and snacks with insulin .  His grandfather admits that his blood sugars were much better on an insulin  pump.  We linked the sensor to his pump and he filled a cartridge with Novolog  insulin  without any assistance from me.  His pump was linked to downloaded and linked to [facility].  He did not start this pump now due to the fact that he gave his lantus  last night at 10 PM.  He will start this tonight at 8PM.  He promised to return tomorrow to review pump protocols and go over checklist of topics for pump training.  Appointment scheduled for 1PM.  He had no final questions.

## 2024-03-05 ENCOUNTER — Encounter: Payer: MEDICAID | Admitting: Nutrition

## 2024-03-05 DIAGNOSIS — E1042 Type 1 diabetes mellitus with diabetic polyneuropathy: Secondary | ICD-10-CM

## 2024-03-05 NOTE — Patient Instructions (Signed)
 Start pump tonight at 8PM Bolus for all meals and snacks Change cartridge every 3 days Change sensor every 14 days.

## 2024-03-11 NOTE — Progress Notes (Signed)
 Patient had not started back on his insulin  pump.  He is here today to review this with me.  He filled a cartridge and attached a needle infusion set without any assistance from me.  We reivewed the need to bolus before all meals and snacks and to do this 10-15 minutes before eating. We also reviewed high blood sugar protocols and he reported good understanding of this.  His pump was linked to our practice by downloading into [facility] system.  He was told to keep his source app always running so that we can monitor his readings if he calls with high or low readings.   He had no questions for me at this time.

## 2024-03-11 NOTE — Patient Instructions (Signed)
 Bolus for all meals and snacks 10-15 minutes before eating Change cartridge/infusion set every 3 days Keep t-connect app running all the time Call Tandem help line if questions about how to use the pump.

## 2024-03-17 ENCOUNTER — Encounter: Payer: Self-pay | Admitting: "Endocrinology

## 2024-03-17 ENCOUNTER — Ambulatory Visit (INDEPENDENT_AMBULATORY_CARE_PROVIDER_SITE_OTHER): Payer: MEDICAID | Admitting: "Endocrinology

## 2024-03-17 VITALS — BP 102/70 | HR 104 | Ht 63.0 in | Wt 96.0 lb

## 2024-03-17 DIAGNOSIS — E7800 Pure hypercholesterolemia, unspecified: Secondary | ICD-10-CM | POA: Diagnosis not present

## 2024-03-17 DIAGNOSIS — G629 Polyneuropathy, unspecified: Secondary | ICD-10-CM

## 2024-03-17 DIAGNOSIS — E1065 Type 1 diabetes mellitus with hyperglycemia: Secondary | ICD-10-CM | POA: Diagnosis not present

## 2024-03-17 MED ORDER — DEXCOM G7 SENSOR MISC: 1.0000 | 1 refills | Status: AC

## 2024-03-17 MED ORDER — INSULIN SYRINGES (DISPOSABLE) U-100 1 ML MISC: 1.0000 | 3 refills | Status: AC | PRN

## 2024-03-17 NOTE — Addendum Note (Signed)
 Addended by: Zander Ingham on: 03/17/2024 04:31 PM   Modules accepted: Orders

## 2024-03-17 NOTE — Progress Notes (Addendum)
 Outpatient Endocrinology Note Keith Birmingham, MD  03/17/24   Keith [name] 21-Jan-1998 [id number]  Referring Provider: Deborah Dallas Sharper,* Primary Care Provider: Deborah Dallas Sharper, DO Reason for consultation: Subjective   Assessment & Plan  Diagnoses and all orders for this visit:  Neuropathy  Uncontrolled type 1 diabetes mellitus with hyperglycemia (HCC) -     Continuous Glucose Sensor (DEXCOM G7 SENSOR) MISC; Inject 1 Device into the skin as directed. Change sensor every 10 days. Use to monitor glucose continuously.  Pure hypercholesterolemia  Other orders -     Insulin  Syringes, Disposable, U-100 1 ML MISC; 1 Device by Does not apply route as needed (for insulinloasinf in insulin  pump).    Diabetes Type I complicated by undetectably high blood sugars, No results found for: GFR Hba1c goal less than 7, current Hba1c is  Lab Results  Component Value Date   HGBA1C <14 01/25/2023   Will recommend the following: 03/17/2024: Continue current regimen, patient wants to resume T-Slim pump, but has not received charger yet  Walked patient through methods of carb counting and correction because patient does not seem to be doing the carb counting correctly, sometimes doing 1 for time and sometimes 1-15, not using correction at all and not taking NovoLog  15 minutes before meals, instead of taking it after meals Goal blood sugar is 150 two hours after meals Stick to 60 gms of carbs per meal  Carbohydrates ratio 1:10 (divide carbs by 10) Correction scale: Use in addition to your meal time/short acting insulin  based on blood sugars as follows:  151 - 175: 1 unit 176 - 200: 2 units 201 - 225: 3 units 226 - 250: 4 units 251 - 275: 5 units 276 - 300: 6 units 301 - 325: 7 units 326 - 350: 8 units 351 - 375: 9 units 376 - 400: 10 units  Previously, Started diabetes education today to assess if the patient understands his pump well enough to resume it Patient  will  come back tomorrow to DM educator to start it since already took lantus  today   Discussed the importance of correct carb counting Patient is currently taking 40 units of Lantus  nightly with NovoLog  with carb ratio 1:10 (divide carbs by 10) with correction at 1:25>150 Reports taking NovoLog  6 units this morning for [employer] meal leading to blood sugars undetectably high afterwards  Patient previously on pump and supposedly was doing well at one point on pump.  His A1c was undetectably high, more than 14 in 11/2023 Will make changes based on accurate blood sugar data based off of Dexcom sensor.  Today, currently Unable to make any changes currently given lack of recent blood sugar data on BG meter and CGM data is old   No known contraindications/side effects to any of above medications  -Last LD and Tg are as follows: Lab Results  Component Value Date   LDLCALC 121 (H) 01/16/2022    Lab Results  Component Value Date   TRIG 57 01/16/2022   -not on statin  -Follow low fat diet and exercise   -Blood pressure goal <140/90 - Microalbumin/creatinine goal is < 30 -Last MA/Cr is as follows: Lab Results  Component Value Date   MICROALBUR 0.8 01/16/2022   -not on ACE/ARB  -diet changes including salt restriction -limit eating outside -counseled BP targets per standards of diabetes care -uncontrolled blood pressure can lead to retinopathy, nephropathy and cardiovascular and atherosclerotic heart disease  Reviewed and counseled on: -A1C target -Blood sugar  targets -Complications of uncontrolled diabetes  -Checking blood sugar before meals and bedtime and bring log next visit -All medications with mechanism of action and side effects -Hypoglycemia management: rule of 15's, Glucagon  Emergency Kit and medical alert ID -low-carb low-fat plate-method diet -At least 20 minutes of physical activity per day -Annual dilated retinal eye exam and foot exam -compliance and follow up  needs -follow up as scheduled or earlier if problem gets worse  Call if blood sugar is less than 70 or consistently above 250    Take a 15 gm snack of carbohydrate at bedtime before you go to sleep if your blood sugar is less than 100.    If you are going to fast after midnight for a test or procedure, ask your physician for instructions on how to reduce/decrease your insulin  dose.    Call if blood sugar is less than 70 or consistently above 250  -Treating a low sugar by rule of 15  (15 gms of sugar every 15 min until sugar is more than 70) If you feel your sugar is low, test your sugar to be sure If your sugar is low (less than 70), then take 15 grams of a fast acting Carbohydrate (3-4 glucose tablets or glucose gel or 4 ounces of juice or regular soda) Recheck your sugar 15 min after treating low to make sure it is more than 70 If sugar is still less than 70, treat again with 15 grams of carbohydrate          Don't drive the hour of hypoglycemia  If unconscious/unable to eat or drink by mouth, use glucagon  injection or nasal spray baqsimi  and call 911. Can repeat again in 15 min if still unconscious.  Return in about 4 weeks (around 04/14/2024).   I have reviewed current medications, nurse's notes, allergies, vital signs, past medical and surgical history, family medical history, and social history for this encounter. Counseled patient on symptoms, examination findings, lab findings, imaging results, treatment decisions and monitoring and prognosis. The patient understood the recommendations and agrees with the treatment plan. All questions regarding treatment plan were fully answered.  Keith Birmingham, MD  03/17/24    History of Present Illness Keith [name] is a 23 y.o. year old male who presents for evaluation of Type I diabetes mellitus.  Keith [name] was first diagnosed at age 23.   Diabetes education +  Home diabetes regimen: Lantus  40 units at bedtime Novolog  IC 1:10  and ISF BG/12 Admitted 12/10/23 for DKA and taken off of insulin  pump   COMPLICATIONS -  MI/Stroke -  retinopathy +  neuropathy -  nephropathy  SYMPTOMS REVIEWED - Polyuria + Weight loss + Blurred vision  BLOOD SUGAR DATA Cgm reviewed, report is from 2024 with elevated BG BG meter reviewed, has scant intermittent data with high BG   Physical Exam  BP 102/70   Pulse (!) 104   Ht 5' 3 (1.6 m)   Wt 96 lb (43.5 kg)   SpO2 98%   BMI 17.01 kg/m    Constitutional: well developed, well nourished Head: normocephalic, atraumatic Eyes: sclera anicteric, no redness Neck: supple Lungs: normal respiratory effort Neurology: alert and oriented Skin: dry, no appreciable rashes Musculoskeletal: no appreciable defects Psychiatric: normal mood and affect Diabetic Foot Exam - Simple   No data filed      Current Medications Patient's Medications  New Prescriptions   INSULIN  SYRINGES, DISPOSABLE, U-100 1 ML MISC    1 Device by Does not  apply route as needed (for insulinloasinf in insulin  pump).  Previous Medications   ACCU-CHEK FASTCLIX LANCETS MISC    TEST 6 TIMES DAILY AS DIRECTED   ACCU-CHEK GUIDE TEST STRIP    TEST BLOOD SUGAR 8 TIMES DAILY   BD PEN NEEDLE NANO 2ND GEN 32G X 4 MM MISC    USE 1 NEEDLE 6 TIMES DAILY   BLOOD GLUCOSE MONITORING SUPPL (ACCU-CHEK GUIDE) W/DEVICE KIT    USE TO CHECK BLOOD GLUCOSE 8 TIMES DAILY   ERYTHROMYCIN  OPHTHALMIC OINTMENT    Place 1 Application into the left eye at bedtime. If not improved in 5 days please seek additional medical advice.   GLUCAGON  (BAQSIMI  TWO PACK) 3 MG/DOSE POWD    Place 1 application  into the nose as needed. Use as directed if unconscious, unable to take food po, or having a seizure due to hypoglycemia   INSULIN  ASPART (NOVOLOG ) 100 UNIT/ML INJECTION    INJECT UP TO 300 UNITS UNDER THE SKIN EVERY 2 DAYS INTO INSULIN  PUMP   INSULIN  PEN NEEDLE 32G X 4 MM MISC    Use as directed   LANTUS  SOLOSTAR 100 UNIT/ML SOLOSTAR PEN    INJECT  UP TO 50 UNITS UNDER THE SKIN PER DAY   TETRAHYDROZOLINE 0.05 % OPHTHALMIC SOLUTION    Using generic over the counter eye drops.   URINE GLUCOSE-KETONES TEST STRP    1 each by Other route as needed.  Modified Medications   Modified Medication Previous Medication   CONTINUOUS GLUCOSE SENSOR (DEXCOM G7 SENSOR) MISC Continuous Glucose Sensor (DEXCOM G7 SENSOR) MISC      Inject 1 Device into the skin as directed. Change sensor every 10 days. Use to monitor glucose continuously.    Inject 1 Device into the skin as directed. Change sensor every 10 days. Use to monitor glucose continuously.  Discontinued Medications   NOVOLOG  FLEXPEN 100 UNIT/ML FLEXPEN    ADMINISTER UP TO 50 UNITS UNDER THE SKIN DAILY    Allergies Allergies  Allergen Reactions   Amoxicillin  Hives and Rash   Penicillins Hives and Rash    Has patient had a PCN reaction causing immediate rash, facial/tongue/throat swelling, SOB or lightheadedness with hypotension: Yes Has patient had a PCN reaction causing severe rash involving mucus membranes or skin necrosis: Yes Has patient had a PCN reaction that required hospitalization: No Has patient had a PCN reaction occurring within the last 10 years: Yes If all of the above answers are NO, then may proceed with Cephalosporin use.     Past Medical History Past Medical History:  Diagnosis Date   Anxiety    Asthma    Auditory hallucinations    Cannabis hyperemesis syndrome concurrent with and due to cannabis abuse (HCC)    Depression    Diabetes mellitus without complication (HCC) 08/03/2015   New onset   Obesity    PTSD (post-traumatic stress disorder)    Social problem 08/03/2015   Unintentional weight loss 06/07/2017    Past Surgical History Past Surgical History:  Procedure Laterality Date   FRACTURE SURGERY     Right arm    Family History family history includes Anxiety disorder in his mother; Cancer in his maternal grandfather and maternal grandmother; Diabetes  in his mother; Heart disease in his maternal grandmother; Hyperlipidemia in his maternal grandfather; Hypothyroidism in his maternal grandmother.  Social History Social History   Socioeconomic History   Marital status: Single    Spouse name: Not on file   Number of  children: Not on file   Years of education: Not on file   Highest education level: Not on file  Occupational History   Not on file  Tobacco Use   Smoking status: Never    Passive exposure: Past   Smokeless tobacco: Never  Substance and Sexual Activity   Alcohol use: No   Drug use: Yes    Types: Marijuana   Sexual activity: Never  Other Topics Concern   Not on file  Social History Narrative   Mom- bipolar- off her meds & non-compliant Type DM- refuses insulin  for self   Social Drivers of Health   Financial Resource Strain: Low Risk  (12/11/2023)   [facility]   Overall Financial Resource Strain (CARDIA)    Difficulty of Paying Living Expenses: Not hard at all  Food Insecurity: Food Insecurity Present (12/12/2023)   Received from [facility]   Hunger Vital Sign    Within the past 12 months, you worried that your food would run out before you got the money to buy more.: Sometimes true    Within the past 12 months, the food you bought just didn't last and you didn't have money to get more.: Never true  Transportation Needs: No Transportation Needs (12/11/2023)   Received from [facility]   PRAPARE - Transportation    Lack of Transportation (Medical): No    Lack of Transportation (Non-Medical): No  Physical Activity: Inactive (03/10/2023)   Received from [facility]   Exercise Vital Sign    On average, how many days per week do you engage in moderate to strenuous exercise (like a brisk walk)?: 0 days    On average, how many minutes do you engage in exercise at this level?: 0 min  Stress: Stress Concern Present (07/07/2023)   Received from [facility]  Occupational Health - Occupational Stress Questionnaire    Feeling of Stress : To some extent  Social Connections: Moderately Isolated (07/07/2023)   Received from [facility]   Social Connection and Isolation Panel    In a typical week, how many times do you talk on the phone with family, friends, or neighbors?: More than three times a week    How often do you get together with friends or relatives?: More than three times a week    How often do you attend church or religious services?: More than 4 times per year    Do you belong to any clubs or organizations such as church groups, unions, fraternal or athletic groups, or school groups?: No    How often do you attend meetings of the clubs or organizations you belong to?: Never    Are you married, widowed, divorced, separated, never married, or living with a partner?: Never married  Intimate Partner Violence: Not At Risk (12/11/2023)   Received from [facility]   Humiliation, Afraid, Rape, and Kick questionnaire    Within the last year, have you been afraid of your partner or ex-partner?: No    Within the last year, have you been humiliated or emotionally abused in other ways by your partner or ex-partner?: No    Within the last year, have you been kicked, hit, slapped, or otherwise physically hurt by your partner or ex-partner?: No    Within the last year, have you been raped or forced to have any kind of sexual activity by your partner or ex-partner?: No    Lab  Results  Component Value Date   HGBA1C <14 01/25/2023   HGBA1C >14 10/26/2022   HGBA1C >14 04/14/2022   Lab Results  Component Value Date   CHOL 207 (H) 01/16/2022   Lab Results  Component Value Date   HDL 72 01/16/2022   Lab Results  Component Value Date   LDLCALC 121 (H) 01/16/2022   Lab Results  Component Value Date   TRIG 57 01/16/2022   Lab Results  Component Value Date   CHOLHDL 2.9 01/16/2022   Lab Results  Component Value Date   CREATININE 0.71  01/16/2022   No results found for: GFR Lab Results  Component Value Date   MICROALBUR 0.8 01/16/2022      Component Value Date/Time   NA 137 01/16/2022 1431   K 4.4 01/16/2022 1431   CL 100 01/16/2022 1431   CO2 23 01/16/2022 1431   GLUCOSE 272 (H) 01/16/2022 1431   BUN 11 01/16/2022 1431   CREATININE 0.71 01/16/2022 1431   CALCIUM 10.0 01/16/2022 1431   PROT 7.1 01/16/2022 1431   ALBUMIN 3.5 11/06/2021 1136   AST 10 (L) 01/16/2022 1431   ALT 11 01/16/2022 1431   ALKPHOS 69 11/06/2021 1136   BILITOT 0.8 01/16/2022 1431   GFRNONAA >60 11/08/2021 0759   GFRAA NOT CALCULATED 05/16/2020 1216      Latest Ref Rng & Units 01/16/2022    2:31 PM 11/08/2021    7:59 AM 11/08/2021    3:25 AM  BMP  Glucose 65 - 99 mg/dL 727  752  782   BUN 7 - 20 mg/dL 11  5  9    Creatinine 0.60 - 1.24 mg/dL 9.28  9.49  9.39   BUN/Creat Ratio 6 - 22 (calc) NOT APPLICABLE     Sodium 135 - 146 mmol/L 137  138  137   Potassium 3.8 - 5.1 mmol/L 4.4  3.6  3.2   Chloride 98 - 110 mmol/L 100  101  100   CO2 20 - 32 mmol/L 23  30  31    Calcium 8.9 - 10.4 mg/dL 89.9  8.2  8.5        Component Value Date/Time   WBC 5.5 04/14/2022 1059   RBC 5.46 04/14/2022 1059   HGB 14.5 04/14/2022 1059   HCT 45.2 04/14/2022 1059   PLT 344 04/14/2022 1059   MCV 82.8 04/14/2022 1059   MCH 26.6 (L) 04/14/2022 1059   MCHC 32.1 04/14/2022 1059   RDW 12.7 04/14/2022 1059   LYMPHSABS 1,870 04/14/2022 1059   MONOABS 1.3 (H) 04/29/2021 1710   EOSABS 22 04/14/2022 1059   BASOSABS 61 04/14/2022 1059     Parts of this note may have been dictated using voice recognition software. There may be variances in spelling and vocabulary which are unintentional. Not all errors are proofread. Please notify the dino if any discrepancies are noted or if the meaning of any statement is not clear.

## 2024-03-17 NOTE — Patient Instructions (Addendum)
 Goal blood sugar is 150 two hours after meals Stick to 60 gms of carbs per meal  Carbohydrates ratio 1:10 (divide carbs by 10) Correction scale: Use in addition to your meal time/short acting insulin  based on blood sugars as follows:  151 - 175: 1 unit 176 - 200: 2 units 201 - 225: 3 units 226 - 250: 4 units 251 - 275: 5 units 276 - 300: 6 units 301 - 325: 7 units 326 - 350: 8 units 351 - 375: 9 units 376 - 400: 10 units

## 2024-04-11 ENCOUNTER — Telehealth: Payer: Self-pay | Admitting: Dietician

## 2024-04-11 NOTE — Telephone Encounter (Signed)
 Patient's grandfather called.  Patient needs a prior authorization for his dexcom.  Will send to the clinical pool.  Leita Constable, RD, LDN, CDCES, DipACLM

## 2024-04-15 ENCOUNTER — Other Ambulatory Visit (HOSPITAL_COMMUNITY): Payer: Self-pay

## 2024-04-15 ENCOUNTER — Telehealth: Payer: Self-pay

## 2024-04-15 NOTE — Telephone Encounter (Signed)
 Pharmacy Patient Advocate Encounter   Received notification from Pt Calls Messages that prior authorization for Dexcom G7 sensor is required/requested.   Insurance verification completed.   The patient is insured through [facility] .   Per test claim: PA required; PA submitted to above mentioned insurance via CoverMyMeds Key/confirmation #/EOC [id number] Status is pending

## 2024-04-16 ENCOUNTER — Ambulatory Visit: Payer: MEDICAID | Admitting: "Endocrinology

## 2024-04-21 NOTE — Telephone Encounter (Signed)
 Pharmacy Patient Advocate Encounter  Received notification from [facility] that Prior Authorization for Dexcom G7 sensor has been APPROVED from 04/15/24 to 04/15/25   PA #/Case ID/Reference #: [id number]

## 2024-04-25 ENCOUNTER — Encounter: Payer: Self-pay | Admitting: "Endocrinology

## 2024-04-25 ENCOUNTER — Ambulatory Visit (INDEPENDENT_AMBULATORY_CARE_PROVIDER_SITE_OTHER): Payer: MEDICAID | Admitting: "Endocrinology

## 2024-04-25 VITALS — BP 120/80 | HR 122 | Ht 63.0 in | Wt 95.0 lb

## 2024-04-25 DIAGNOSIS — G629 Polyneuropathy, unspecified: Secondary | ICD-10-CM | POA: Diagnosis not present

## 2024-04-25 DIAGNOSIS — E7800 Pure hypercholesterolemia, unspecified: Secondary | ICD-10-CM

## 2024-04-25 DIAGNOSIS — E1065 Type 1 diabetes mellitus with hyperglycemia: Secondary | ICD-10-CM | POA: Diagnosis not present

## 2024-04-25 LAB — GLUCOSE, POCT (MANUAL RESULT ENTRY): POC Glucose: 514 mg/dL — AB (ref 70–99)

## 2024-04-25 NOTE — Patient Instructions (Addendum)
 Will recommend the following: Lantuss 40 units every day. Increase by 2 units a every other day until fasting blood sugar is less than 150. Stay on that dose.  Novolog  carbohydrates ratio 1:8 (divide carbs by 8) Novolog  correction scale: Use in addition to your meal time/short acting insulin  based on blood sugars as follows:  151 - 175: 1 unit 176 - 200: 2 units 201 - 225: 3 units 226 - 250: 4 units 251 - 275: 5 units 276 - 300: 6 units 301 - 325: 7 units 326 - 350: 8 units 351 - 375: 9 units 376 - 400: 10 units

## 2024-04-25 NOTE — Progress Notes (Signed)
 Outpatient Endocrinology Note Keith Birmingham, MD  04/25/24   Keith [name] 12-03-2000 [id number]  Referring Provider: Deborah Dallas Sharper,* Primary Care Provider: Deborah Dallas Sharper, DO Reason for consultation: Subjective   Assessment & Plan  Diagnoses and all orders for this visit:  Uncontrolled type 1 diabetes mellitus with hyperglycemia (HCC) -     POCT Glucose (CBG)  Neuropathy  Pure hypercholesterolemia   Diabetes Type I complicated by undetectably high blood sugars, No results found for: GFR Hba1c goal less than 7, current Hba1c is  Lab Results  Component Value Date   HGBA1C <14 01/25/2023   Will recommend the following: Had a long discussion over diabetes control, complications and need for improved blood sugars Lantuss 40 units every day. Increase by 2 units every other day until fasting blood sugar is less than 150. Stay on that dose.  Novolog  carbohydrates ratio 1:8 (divide carbs by 8) Novolog  correction scale: Use in addition to your meal time/short acting insulin  based on blood sugars as follows:  151 - 175: 1 unit 176 - 200: 2 units 201 - 225: 3 units 226 - 250: 4 units 251 - 275: 5 units 276 - 300: 6 units 301 - 325: 7 units 326 - 350: 8 units 351 - 375: 9 units 376 - 400: 10 units   03/17/2024: Continue current regimen, patient wants to resume T-Slim pump, but has not received charger yet  Walked patient through methods of carb counting and correction because patient does not seem to be doing the carb counting correctly, sometimes doing 1 for time and sometimes 1-15, not using correction at all and not taking NovoLog  15 minutes before meals, instead of taking it after meals Goal blood sugar is 150 two hours after meals Stick to 60 gms of carbs per meal  Carbohydrates ratio 1:8 (divide carbs by 8)  Previously, Started diabetes education today to assess if the patient understands his pump well enough to resume it Patient  will come  back tomorrow to DM educator to start it since already took lantus  today   Discussed the importance of correct carb counting Patient is currently taking 40 units of Lantus  nightly with NovoLog  with carb ratio 1:10 (divide carbs by 10) with correction at 1:25>150 Reports taking NovoLog  6 units this morning for [employer] meal leading to blood sugars undetectably high afterwards  Patient previously on pump and supposedly was doing well at one point on pump.  His A1c was undetectably high, more than 14 in 11/2023 Will make changes based on accurate blood sugar data based off of Dexcom sensor.  Today, currently Unable to make any changes currently given lack of recent blood sugar data on BG meter and CGM data is old   No known contraindications/side effects to any of above medications  -Last LD and Tg are as follows: Lab Results  Component Value Date   LDLCALC 121 (H) 01/16/2022    Lab Results  Component Value Date   TRIG 57 01/16/2022   -not on statin  -Follow low fat diet and exercise   -Blood pressure goal <140/90 - Microalbumin/creatinine goal is < 30 -Last MA/Cr is as follows: Lab Results  Component Value Date   MICROALBUR 0.8 01/16/2022   -not on ACE/ARB  -diet changes including salt restriction -limit eating outside -counseled BP targets per standards of diabetes care -uncontrolled blood pressure can lead to retinopathy, nephropathy and cardiovascular and atherosclerotic heart disease  Reviewed and counseled on: -A1C target -Blood sugar targets -Complications  of uncontrolled diabetes  -Checking blood sugar before meals and bedtime and bring log next visit -All medications with mechanism of action and side effects -Hypoglycemia management: rule of 15's, Glucagon  Emergency Kit and medical alert ID -low-carb low-fat plate-method diet -At least 20 minutes of physical activity per day -Annual dilated retinal eye exam and foot exam -compliance and follow up needs -follow up  as scheduled or earlier if problem gets worse  Call if blood sugar is less than 70 or consistently above 250    Take a 15 gm snack of carbohydrate at bedtime before you go to sleep if your blood sugar is less than 100.    If you are going to fast after midnight for a test or procedure, ask your physician for instructions on how to reduce/decrease your insulin  dose.    Call if blood sugar is less than 70 or consistently above 250  -Treating a low sugar by rule of 15  (15 gms of sugar every 15 min until sugar is more than 70) If you feel your sugar is low, test your sugar to be sure If your sugar is low (less than 70), then take 15 grams of a fast acting Carbohydrate (3-4 glucose tablets or glucose gel or 4 ounces of juice or regular soda) Recheck your sugar 15 min after treating low to make sure it is more than 70 If sugar is still less than 70, treat again with 15 grams of carbohydrate          Don't drive the hour of hypoglycemia  If unconscious/unable to eat or drink by mouth, use glucagon  injection or nasal spray baqsimi  and call 911. Can repeat again in 15 min if still unconscious.  Return in about 7 weeks (around 06/16/2024).   I have reviewed current medications, nurse's notes, allergies, vital signs, past medical and surgical history, family medical history, and social history for this encounter. Counseled patient on symptoms, examination findings, lab findings, imaging results, treatment decisions and monitoring and prognosis. The patient understood the recommendations and agrees with the treatment plan. All questions regarding treatment plan were fully answered.  Keith Birmingham, MD  04/25/24    History of Present Illness Keith [name] is a 23 y.o. year old male who presents for evaluation of Type I diabetes mellitus.  Keith [name] was first diagnosed at age 41.   Diabetes education +  Home diabetes regimen: Lantus  40 units at bedtime Novolog  IC 1:10 and Correction  scale: Use in addition to your meal time/short acting insulin  based on blood sugars as follows:  151 - 175: 1 unit 176 - 200: 2 units 201 - 225: 3 units 226 - 250: 4 units 251 - 275: 5 units 276 - 300: 6 units 301 - 325: 7 units 326 - 350: 8 units 351 - 375: 9 units 376 - 400: 10 units   Admitted 12/10/23 for DKA and taken off of insulin  pump   COMPLICATIONS -  MI/Stroke -  retinopathy +  neuropathy -  nephropathy  BLOOD SUGAR DATA No blood sugar data available Per patient recall, blood sugars running between 200s and 500s Currently in the clinic patient's blood sugars are more than 500s after eating 2 bagels, hashbrowns and diet soda in the morning, instructed patient to go to the ER to rule out DKA  Physical Exam  BP 120/80   Pulse (!) 122   Ht 5' 3 (1.6 m)   Wt 95 lb (43.1 kg)   SpO2 98%  BMI 16.83 kg/m    Constitutional: well developed, well nourished Head: normocephalic, atraumatic Eyes: sclera anicteric, no redness Neck: supple Lungs: normal respiratory effort Neurology: alert and oriented Skin: dry, no appreciable rashes Musculoskeletal: no appreciable defects Psychiatric: normal mood and affect Diabetic Foot Exam - Simple   No data filed      Current Medications Patient's Medications  New Prescriptions   No medications on file  Previous Medications   ACCU-CHEK FASTCLIX LANCETS MISC    TEST 6 TIMES DAILY AS DIRECTED   ACCU-CHEK GUIDE TEST STRIP    TEST BLOOD SUGAR 8 TIMES DAILY   BD PEN NEEDLE NANO 2ND GEN 32G X 4 MM MISC    USE 1 NEEDLE 6 TIMES DAILY   BLOOD GLUCOSE MONITORING SUPPL (ACCU-CHEK GUIDE) W/DEVICE KIT    USE TO CHECK BLOOD GLUCOSE 8 TIMES DAILY   CONTINUOUS GLUCOSE SENSOR (DEXCOM G7 SENSOR) MISC    Inject 1 Device into the skin as directed. Change sensor every 10 days. Use to monitor glucose continuously.   ERYTHROMYCIN  OPHTHALMIC OINTMENT    Place 1 Application into the left eye at bedtime. If not improved in 5 days please seek  additional medical advice.   GLUCAGON  (BAQSIMI  TWO PACK) 3 MG/DOSE POWD    Place 1 application  into the nose as needed. Use as directed if unconscious, unable to take food po, or having a seizure due to hypoglycemia   INSULIN  ASPART (NOVOLOG ) 100 UNIT/ML INJECTION    INJECT UP TO 300 UNITS UNDER THE SKIN EVERY 2 DAYS INTO INSULIN  PUMP   INSULIN  PEN NEEDLE 32G X 4 MM MISC    Use as directed   INSULIN  SYRINGES, DISPOSABLE, U-100 1 ML MISC    1 Device by Does not apply route as needed (for insulinloasinf in insulin  pump).   LANTUS  SOLOSTAR 100 UNIT/ML SOLOSTAR PEN    INJECT UP TO 50 UNITS UNDER THE SKIN PER DAY   TETRAHYDROZOLINE 0.05 % OPHTHALMIC SOLUTION    Using generic over the counter eye drops.   URINE GLUCOSE-KETONES TEST STRP    1 each by Other route as needed.  Modified Medications   No medications on file  Discontinued Medications   No medications on file    Allergies Allergies  Allergen Reactions   Amoxicillin  Hives and Rash   Penicillins Hives and Rash    Has patient had a PCN reaction causing immediate rash, facial/tongue/throat swelling, SOB or lightheadedness with hypotension: Yes Has patient had a PCN reaction causing severe rash involving mucus membranes or skin necrosis: Yes Has patient had a PCN reaction that required hospitalization: No Has patient had a PCN reaction occurring within the last 10 years: Yes If all of the above answers are NO, then may proceed with Cephalosporin use.     Past Medical History Past Medical History:  Diagnosis Date   Anxiety    Asthma    Auditory hallucinations    Cannabis hyperemesis syndrome concurrent with and due to cannabis abuse (HCC)    Depression    Diabetes mellitus without complication (HCC) 08/03/2015   New onset   Obesity    PTSD (post-traumatic stress disorder)    Social problem 08/03/2015   Unintentional weight loss 06/07/2017    Past Surgical History Past Surgical History:  Procedure Laterality Date    FRACTURE SURGERY     Right arm    Family History family history includes Anxiety disorder in his mother; Cancer in his maternal grandfather and maternal grandmother; Diabetes  in his mother; Heart disease in his maternal grandmother; Hyperlipidemia in his maternal grandfather; Hypothyroidism in his maternal grandmother.  Social History Social History   Socioeconomic History   Marital status: Single    Spouse name: Not on file   Number of children: Not on file   Years of education: Not on file   Highest education level: Not on file  Occupational History   Not on file  Tobacco Use   Smoking status: Never    Passive exposure: Past   Smokeless tobacco: Never  Substance and Sexual Activity   Alcohol use: No   Drug use: Yes    Types: Marijuana   Sexual activity: Never  Other Topics Concern   Not on file  Social History Narrative   Mom- bipolar- off her meds & non-compliant Type DM- refuses insulin  for self   Social Drivers of Health   Financial Resource Strain: Low Risk  (03/22/2024)   Received from [facility]   Overall Financial Resource Strain (CARDIA)    How hard is it for you to pay for the very basics like food, housing, medical care, and heating?: Not very hard  Food Insecurity: No Food Insecurity (03/22/2024)   Received [facility]   Hunger Vital Sign    Within the past 12 months, you worried that your food would run out before you got the money to buy more.: Never true    Within the past 12 months, the food you bought just didn't last and you didn't have money to get more.: Never true  Transportation Needs: No Transportation Needs (03/22/2024)   Received from [facility] - Transportation    Lack of Transportation (Medical): No    Lack of Transportation (Non-Medical): No  Physical Activity: Sufficiently Active (03/22/2024)   Received from [facility]   Exercise Vital Sign    On average, how many days per week do you engage in moderate to  strenuous exercise (like a brisk walk)?: 7 days    On average, how many minutes do you engage in exercise at this level?: 60 min  Stress: No Stress Concern Present (03/22/2024)   Received from [facility] - Occupational Stress Questionnaire    Do you feel stress - tense, restless, nervous, or anxious, or unable to sleep at night because your mind is troubled all the time - these days?: Only a little  Social Connections: Moderately Integrated (03/22/2024)   Received from [facility]   Social Connection and Isolation Panel    In a typical week, how many times do you talk on the phone with family, friends, or neighbors?: More than three times a week    How often do you get together with friends or relatives?: More than three times a week    How often do you attend church or religious services?: More than 4 times per year    Do you belong to any clubs or organizations such as church groups, unions, fraternal or athletic groups, or school groups?: Yes    How often do you attend meetings of the clubs or organizations you belong to?: More than 4 times per year    Are you married, widowed, divorced, separated, never married, or living with a partner?: Never married  Intimate Partner Violence: Not At Risk (03/22/2024)   Received from [facility]   Humiliation, Afraid, Rape, and Kick questionnaire    Within the  last year, have you been afraid of your partner or ex-partner?: No    Within the last year, have you been humiliated or emotionally abused in other ways by your partner or ex-partner?: No    Within the last year, have you been kicked, hit, slapped, or otherwise physically hurt by your partner or ex-partner?: No    Within the last year, have you been raped or forced to have any kind of sexual activity by your partner or ex-partner?: No    Lab Results  Component Value Date   HGBA1C <14 01/25/2023   HGBA1C >14 10/26/2022   HGBA1C >14 04/14/2022    Lab Results  Component Value Date   CHOL 207 (H) 01/16/2022   Lab Results  Component Value Date   HDL 72 01/16/2022   Lab Results  Component Value Date   LDLCALC 121 (H) 01/16/2022   Lab Results  Component Value Date   TRIG 57 01/16/2022   Lab Results  Component Value Date   CHOLHDL 2.9 01/16/2022   Lab Results  Component Value Date   CREATININE 0.71 01/16/2022   No results found for: GFR Lab Results  Component Value Date   MICROALBUR 0.8 01/16/2022      Component Value Date/Time   NA 137 01/16/2022 1431   K 4.4 01/16/2022 1431   CL 100 01/16/2022 1431   CO2 23 01/16/2022 1431   GLUCOSE 272 (H) 01/16/2022 1431   BUN 11 01/16/2022 1431   CREATININE 0.71 01/16/2022 1431   CALCIUM 10.0 01/16/2022 1431   PROT 7.1 01/16/2022 1431   ALBUMIN 3.5 11/06/2021 1136   AST 10 (L) 01/16/2022 1431   ALT 11 01/16/2022 1431   ALKPHOS 69 11/06/2021 1136   BILITOT 0.8 01/16/2022 1431   GFRNONAA >60 11/08/2021 0759   GFRAA NOT CALCULATED 05/16/2020 1216      Latest Ref Rng & Units 01/16/2022    2:31 PM 11/08/2021    7:59 AM 11/08/2021    3:25 AM  BMP  Glucose 65 - 99 mg/dL 727  752  782   BUN 7 - 20 mg/dL 11  5  9    Creatinine 0.60 - 1.24 mg/dL 9.28  9.49  9.39   BUN/Creat Ratio 6 - 22 (calc) NOT APPLICABLE     Sodium 135 - 146 mmol/L 137  138  137   Potassium 3.8 - 5.1 mmol/L 4.4  3.6  3.2   Chloride 98 - 110 mmol/L 100  101  100   CO2 20 - 32 mmol/L 23  30  31    Calcium 8.9 - 10.4 mg/dL 89.9  8.2  8.5        Component Value Date/Time   WBC 5.5 04/14/2022 1059   RBC 5.46 04/14/2022 1059   HGB 14.5 04/14/2022 1059   HCT 45.2 04/14/2022 1059   PLT 344 04/14/2022 1059   MCV 82.8 04/14/2022 1059   MCH 26.6 (L) 04/14/2022 1059   MCHC 32.1 04/14/2022 1059   RDW 12.7 04/14/2022 1059   LYMPHSABS 1,870 04/14/2022 1059   MONOABS 1.3 (H) 04/29/2021 1710   EOSABS 22 04/14/2022 1059   BASOSABS 61 04/14/2022 1059     Parts of this note may have been dictated using  voice recognition software. There may be variances in spelling and vocabulary which are unintentional. Not all errors are proofread. Please notify the dino if any discrepancies are noted or if the meaning of any statement is not clear.

## 2024-06-19 ENCOUNTER — Ambulatory Visit: Payer: MEDICAID | Admitting: "Endocrinology

## 2024-06-19 DIAGNOSIS — E1065 Type 1 diabetes mellitus with hyperglycemia: Secondary | ICD-10-CM

## 2024-06-23 ENCOUNTER — Telehealth: Payer: Self-pay | Admitting: Dietician

## 2024-06-23 NOTE — Telephone Encounter (Signed)
 Returned patient's grandfather's call. He needs the spelling of his provider for insurance purposes. He has seen Rock Dasen, RN, CDCES and [name], MD.  Spellings of their names provided.  He has no further questions.  Leita Constable, RD, LDN, CDCES, DipACLM

## 2024-08-13 ENCOUNTER — Encounter: Payer: Self-pay | Admitting: "Endocrinology

## 2024-08-13 ENCOUNTER — Ambulatory Visit (INDEPENDENT_AMBULATORY_CARE_PROVIDER_SITE_OTHER): Payer: MEDICAID | Admitting: "Endocrinology

## 2024-08-13 ENCOUNTER — Other Ambulatory Visit: Payer: MEDICAID

## 2024-08-13 VITALS — BP 102/80 | HR 120 | Ht 63.0 in | Wt 96.0 lb

## 2024-08-13 DIAGNOSIS — Z91148 Patient's other noncompliance with medication regimen for other reason: Secondary | ICD-10-CM

## 2024-08-13 DIAGNOSIS — G629 Polyneuropathy, unspecified: Secondary | ICD-10-CM

## 2024-08-13 DIAGNOSIS — E1065 Type 1 diabetes mellitus with hyperglycemia: Secondary | ICD-10-CM

## 2024-08-13 DIAGNOSIS — E7800 Pure hypercholesterolemia, unspecified: Secondary | ICD-10-CM

## 2024-08-13 LAB — POCT GLYCOSYLATED HEMOGLOBIN (HGB A1C): HbA1c POC (<> result, manual entry): <15 % (ref 4.0–5.6)

## 2024-08-13 MED ORDER — NOVOLOG FLEXPEN 100 UNIT/ML ~~LOC~~ SOPN: PEN_INJECTOR | SUBCUTANEOUS | 5 refills | Status: AC

## 2024-08-13 MED ORDER — LANTUS SOLOSTAR 100 UNIT/ML ~~LOC~~ SOPN: 40.0000 [IU] | PEN_INJECTOR | Freq: Every day | SUBCUTANEOUS | 1 refills | Status: AC

## 2024-08-13 NOTE — Progress Notes (Signed)
 Outpatient Endocrinology Note Keith Birmingham, MD  08/13/24   Keith [name] August 22, 2001 [id number]  Referring Provider: Deborah Dallas Sharper,* Primary Care Provider: Deborah Dallas Sharper, DO Reason for consultation: Subjective   Assessment & Plan  Diagnoses and all orders for this visit:  Uncontrolled type 1 diabetes mellitus with hyperglycemia (HCC) -     POCT glycosylated hemoglobin (Hb A1C) -     Microalbumin / creatinine urine ratio -     Ambulatory referral to diabetic education -     Ambulatory referral to Podiatry  Neuropathy  Pure hypercholesterolemia  Non compliance w medication regimen  Other orders -     insulin  glargine (LANTUS  SOLOSTAR) 100 UNIT/ML Solostar Pen; Inject 40 Units into the skin daily. -     insulin  aspart (NOVOLOG  FLEXPEN) 100 UNIT/ML FlexPen; Based on carb counting and correction 15 min before each meal, max dose 60 units/day   Diabetes Type I complicated by undetectably high blood sugars, No results found for: GFR Hba1c goal less than 7, current Hba1c is  Lab Results  Component Value Date   HGBA1C <15 08/13/2024   Will recommend the following: Ordered diabetes education Lantus  40 units every day. Increase by 2 units every other day until fasting blood sugar is less than 120. Stay on that dose.  Novolog  carbohydrates ratio 1:8 (divide carbs by 8): 15 min before meals Novolog  correction scale: Use in addition to your meal time/short acting insulin  based on blood sugars as follows:  151 - 175: 1 unit 176 - 200: 2 units 201 - 225: 3 units 226 - 250: 4 units 251 - 275: 5 units 276 - 300: 6 units 301 - 325: 7 units 326 - 350: 8 units 351 - 375: 9 units 376 - 400: 10 units   03/17/2024: Continue current regimen, patient wants to resume T-Slim pump, but has not received charger yet  Walked patient through methods of carb counting and correction because patient does not seem to be doing the carb counting correctly, sometimes doing  1 for time and sometimes 1-15, not using correction at all and not taking NovoLog  15 minutes before meals, instead of taking it after meals Goal blood sugar is 150 two hours after meals Stick to 60 gms of carbs per meal  Carbohydrates ratio 1:8 (divide carbs by 8)  04/25/24: Had a long discussion over diabetes control, complications and need for improved blood sugars  08/13/24: 07/01/2024 went to ED for DKA. No Dexcom data was reported by 9 to 10 days.  Before that patient had 98% high.  Patient reports that he is taking insulin  but based on his report it does not seem so.  Discussed the risks of noncompliance, including incorrect dose prescription which can be life-threatening.  Patient seems to be defiant mood.  Given patient the choice to switch providers to order diabetes education, rediscussed doses and length to patient's grandfather who always accompanies him and request to stick with me as a provider.   Previously, Started diabetes education today to assess if the patient understands his pump well enough to resume it Patient  will come back tomorrow to DM educator to start it since already took lantus  today   Discussed the importance of correct carb counting Patient is currently taking 40 units of Lantus  nightly with NovoLog  with carb ratio 1:10 (divide carbs by 10) with correction at 1:25>150 Reports taking NovoLog  6 units this morning for [employer] meal leading to blood sugars undetectably high afterwards  Patient previously  on pump and supposedly was doing well at one point on pump.  His A1c was undetectably high, more than 14 in 11/2023 Will make changes based on accurate blood sugar data based off of Dexcom sensor.  Today, currently Unable to make any changes currently given lack of recent blood sugar data on BG meter and CGM data is old   No known contraindications/side effects to any of above medications  -Last LD and Tg are as follows: Lab Results  Component Value Date   LDLCALC  121 (H) 01/16/2022    Lab Results  Component Value Date   TRIG 57 01/16/2022   -not on statin  -Follow low fat diet and exercise   -Blood pressure goal <140/90 - Microalbumin/creatinine goal is < 30 -Last MA/Cr is as follows: Lab Results  Component Value Date   MICROALBUR 0.8 01/16/2022   -not on ACE/ARB  -diet changes including salt restriction -limit eating outside -counseled BP targets per standards of diabetes care -uncontrolled blood pressure can lead to retinopathy, nephropathy and cardiovascular and atherosclerotic heart disease  Reviewed and counseled on: -A1C target -Blood sugar targets -Complications of uncontrolled diabetes  -Checking blood sugar before meals and bedtime and bring log next visit -All medications with mechanism of action and side effects -Hypoglycemia management: rule of 15's, Glucagon  Emergency Kit and medical alert ID -low-carb low-fat plate-method diet -At least 20 minutes of physical activity per day -Annual dilated retinal eye exam and foot exam -compliance and follow up needs -follow up as scheduled or earlier if problem gets worse  Call if blood sugar is less than 70 or consistently above 250    Take a 15 gm snack of carbohydrate at bedtime before you go to sleep if your blood sugar is less than 100.    If you are going to fast after midnight for a test or procedure, ask your physician for instructions on how to reduce/decrease your insulin  dose.    Call if blood sugar is less than 70 or consistently above 250  -Treating a low sugar by rule of 15  (15 gms of sugar every 15 min until sugar is more than 70) If you feel your sugar is low, test your sugar to be sure If your sugar is low (less than 70), then take 15 grams of a fast acting Carbohydrate (3-4 glucose tablets or glucose gel or 4 ounces of juice or regular soda) Recheck your sugar 15 min after treating low to make sure it is more than 70 If sugar is still less than 70, treat  again with 15 grams of carbohydrate          Don't drive the hour of hypoglycemia  If unconscious/unable to eat or drink by mouth, use glucagon  injection or nasal spray baqsimi  and call 911. Can repeat again in 15 min if still unconscious.  Return in about 3 months (around 11/13/2024) for labs today.   I have reviewed current medications, nurse's notes, allergies, vital signs, past medical and surgical history, family medical history, and social history for this encounter. Counseled patient on symptoms, examination findings, lab findings, imaging results, treatment decisions and monitoring and prognosis. The patient understood the recommendations and agrees with the treatment plan. All questions regarding treatment plan were fully answered.  Keith Birmingham, MD  08/13/24    History of Present Illness Clarence Cogswell is a 23 y.o. year old male who presents for evaluation of Type I diabetes mellitus.  Sherif Millspaugh was first diagnosed at age  13.   Diabetes education +  Home diabetes regimen: Lantus  40 units at bedtime Novolog  IC 1:10 and Correction scale: Use in addition to your meal time/short acting insulin  based on blood sugars as follows:  151 - 175: 1 unit 176 - 200: 2 units 201 - 225: 3 units 226 - 250: 4 units 251 - 275: 5 units 276 - 300: 6 units 301 - 325: 7 units 326 - 350: 8 units 351 - 375: 9 units 376 - 400: 10 units   Admitted 12/10/23 for DKA and taken off of insulin  pump   COMPLICATIONS -  MI/Stroke -  retinopathy +  neuropathy -  nephropathy  BLOOD SUGAR DATA CGM interpretation: At today's visit, we reviewed her CGM downloads. The full report is scanned in the media. Reviewing the CGM trends, BG are elevated 98% of the time in the 3 to 4-day data available.   Physical Exam  BP 102/80   Pulse (!) 120   Ht 5' 3 (1.6 m)   Wt 96 lb (43.5 kg)   SpO2 95%   BMI 17.01 kg/m    Constitutional: well developed, well nourished Head: normocephalic,  atraumatic Eyes: sclera anicteric, no redness Neck: supple Lungs: normal respiratory effort Neurology: alert and oriented Skin: dry, no appreciable rashes Musculoskeletal: no appreciable defects Psychiatric: normal mood and affect Diabetic Foot Exam - Simple   Simple Foot Form Diabetic Foot exam was performed with the following findings: Yes 08/13/2024  2:03 PM  Visual Inspection No deformities, no ulcerations, no other skin breakdown bilaterally: Yes Sensation Testing Intact to touch and monofilament testing bilaterally: Yes Pulse Check See comments: Yes Comments      Current Medications Patient's Medications  New Prescriptions   INSULIN  ASPART (NOVOLOG  FLEXPEN) 100 UNIT/ML FLEXPEN    Based on carb counting and correction 15 min before each meal, max dose 60 units/day  Previous Medications   ACCU-CHEK FASTCLIX LANCETS MISC    TEST 6 TIMES DAILY AS DIRECTED   ACCU-CHEK GUIDE TEST STRIP    TEST BLOOD SUGAR 8 TIMES DAILY   BD PEN NEEDLE NANO 2ND GEN 32G X 4 MM MISC    USE 1 NEEDLE 6 TIMES DAILY   BLOOD GLUCOSE MONITORING SUPPL (ACCU-CHEK GUIDE) W/DEVICE KIT    USE TO CHECK BLOOD GLUCOSE 8 TIMES DAILY   ERYTHROMYCIN  OPHTHALMIC OINTMENT    Place 1 Application into the left eye at bedtime. If not improved in 5 days please seek additional medical advice.   GLUCAGON  (BAQSIMI  TWO PACK) 3 MG/DOSE POWD    Place 1 application  into the nose as needed. Use as directed if unconscious, unable to take food po, or having a seizure due to hypoglycemia   INSULIN  PEN NEEDLE 32G X 4 MM MISC    Use as directed   INSULIN  SYRINGES, DISPOSABLE, U-100 1 ML MISC    1 Device by Does not apply route as needed (for insulinloasinf in insulin  pump).   TETRAHYDROZOLINE 0.05 % OPHTHALMIC SOLUTION    Using generic over the counter eye drops.   URINE GLUCOSE-KETONES TEST STRP    1 each by Other route as needed.  Modified Medications   Modified Medication Previous Medication   INSULIN  GLARGINE (LANTUS  SOLOSTAR)  100 UNIT/ML SOLOSTAR PEN LANTUS  SOLOSTAR 100 UNIT/ML Solostar Pen      Inject 40 Units into the skin daily.    INJECT UP TO 50 UNITS UNDER THE SKIN PER DAY  Discontinued Medications   INSULIN  ASPART (NOVOLOG ) 100 UNIT/ML  INJECTION    INJECT UP TO 300 UNITS UNDER THE SKIN EVERY 2 DAYS INTO INSULIN  PUMP    Allergies Allergies  Allergen Reactions   Amoxicillin  Hives and Rash   Penicillins Hives and Rash    Has patient had a PCN reaction causing immediate rash, facial/tongue/throat swelling, SOB or lightheadedness with hypotension: Yes Has patient had a PCN reaction causing severe rash involving mucus membranes or skin necrosis: Yes Has patient had a PCN reaction that required hospitalization: No Has patient had a PCN reaction occurring within the last 10 years: Yes If all of the above answers are NO, then may proceed with Cephalosporin use.     Past Medical History Past Medical History:  Diagnosis Date   Anxiety    Asthma    Auditory hallucinations    Cannabis hyperemesis syndrome concurrent with and due to cannabis abuse    Depression    Diabetes mellitus without complication (HCC) 08/03/2015   New onset   Obesity    PTSD (post-traumatic stress disorder)    Social problem 08/03/2015   Unintentional weight loss 06/07/2017    Past Surgical History Past Surgical History:  Procedure Laterality Date   FRACTURE SURGERY     Right arm    Family History family history includes Anxiety disorder in his mother; Cancer in his maternal grandfather and maternal grandmother; Diabetes in his mother; Heart disease in his maternal grandmother; Hyperlipidemia in his maternal grandfather; Hypothyroidism in his maternal grandmother.  Social History Social History   Socioeconomic History   Marital status: Single    Spouse name: Not on file   Number of children: Not on file   Years of education: Not on file   Highest education level: Not on file  Occupational History   Not on file   Tobacco Use   Smoking status: Never    Passive exposure: Past   Smokeless tobacco: Never  Substance and Sexual Activity   Alcohol use: No   Drug use: Yes    Types: Marijuana   Sexual activity: Never  Other Topics Concern   Not on file  Social History Narrative   Mom- bipolar- off her meds & non-compliant Type DM- refuses insulin  for self   Social Drivers of Health   Financial Resource Strain: Low Risk (03/22/2024)   Received from [facility]   Overall Financial Resource Strain (CARDIA)    How hard is it for you to pay for the very basics like food, housing, medical care, and heating?: Not very hard  Food Insecurity: No Food Insecurity (03/22/2024)   Received [facility]   Hunger Vital Sign    Within the past 12 months, you worried that your food would run out before you got the money to buy more.: Never true    Within the past 12 months, the food you bought just didn't last and you didn't have money to get more.: Never true  Transportation Needs: No Transportation Needs (03/22/2024)   Received from [facility] - Transportation    Lack of Transportation (Medical): No    Lack of Transportation (Non-Medical): No  Physical Activity: Sufficiently Active (03/22/2024)   Received from [facility]   Exercise Vital Sign    On average, how many days per week do you engage in moderate to strenuous exercise (like a brisk walk)?: 7 days    On average, how many minutes do you engage in exercise at this level?: 60 min  Stress: No  Stress Concern Present (03/22/2024)   Received from [name] - Occupational Stress Questionnaire    Do you feel stress - tense, restless, nervous, or anxious, or unable to sleep at night because your mind is troubled all the time - these days?: Only a little  Social Connections: Moderately Integrated (03/22/2024)   Received from [facility]   Social Connection and Isolation Panel    In a  typical week, how many times do you talk on the phone with family, friends, or neighbors?: More than three times a week    How often do you get together with friends or relatives?: More than three times a week    How often do you attend church or religious services?: More than 4 times per year    Do you belong to any clubs or organizations such as church groups, unions, fraternal or athletic groups, or school groups?: Yes    How often do you attend meetings of the clubs or organizations you belong to?: More than 4 times per year    Are you married, widowed, divorced, separated, never married, or living with a partner?: Never married  Intimate Partner Violence: Not At Risk (03/22/2024)   Received from [facility]   Humiliation, Afraid, Rape, and Kick questionnaire    Within the last year, have you been afraid of your partner or ex-partner?: No    Within the last year, have you been humiliated or emotionally abused in other ways by your partner or ex-partner?: No    Within the last year, have you been kicked, hit, slapped, or otherwise physically hurt by your partner or ex-partner?: No    Within the last year, have you been raped or forced to have any kind of sexual activity by your partner or ex-partner?: No    Lab Results  Component Value Date   HGBA1C <15 08/13/2024   HGBA1C <14 01/25/2023   HGBA1C >14 10/26/2022   Lab Results  Component Value Date   CHOL 207 (H) 01/16/2022   Lab Results  Component Value Date   HDL 72 01/16/2022   Lab Results  Component Value Date   LDLCALC 121 (H) 01/16/2022   Lab Results  Component Value Date   TRIG 57 01/16/2022   Lab Results  Component Value Date   CHOLHDL 2.9 01/16/2022   Lab Results  Component Value Date   CREATININE 0.71 01/16/2022   No results found for: GFR Lab Results  Component Value Date   MICROALBUR 0.8 01/16/2022      Component Value Date/Time   NA 137 01/16/2022 1431   K 4.4 01/16/2022 1431   CL 100 01/16/2022  1431   CO2 23 01/16/2022 1431   GLUCOSE 272 (H) 01/16/2022 1431   BUN 11 01/16/2022 1431   CREATININE 0.71 01/16/2022 1431   CALCIUM 10.0 01/16/2022 1431   PROT 7.1 01/16/2022 1431   ALBUMIN 3.5 11/06/2021 1136   AST 10 (L) 01/16/2022 1431   ALT 11 01/16/2022 1431   ALKPHOS 69 11/06/2021 1136   BILITOT 0.8 01/16/2022 1431   GFRNONAA >60 11/08/2021 0759   GFRAA NOT CALCULATED 05/16/2020 1216      Latest Ref Rng & Units 01/16/2022    2:31 PM 11/08/2021    7:59 AM 11/08/2021    3:25 AM  BMP  Glucose 65 - 99 mg/dL 727  752  782   BUN 7 - 20 mg/dL 11  5  9  Creatinine 0.60 - 1.24 mg/dL 9.28  9.49  9.39   BUN/Creat Ratio 6 - 22 (calc) NOT APPLICABLE     Sodium 135 - 146 mmol/L 137  138  137   Potassium 3.8 - 5.1 mmol/L 4.4  3.6  3.2   Chloride 98 - 110 mmol/L 100  101  100   CO2 20 - 32 mmol/L 23  30  31    Calcium 8.9 - 10.4 mg/dL 89.9  8.2  8.5        Component Value Date/Time   WBC 5.5 04/14/2022 1059   RBC 5.46 04/14/2022 1059   HGB 14.5 04/14/2022 1059   HCT 45.2 04/14/2022 1059   PLT 344 04/14/2022 1059   MCV 82.8 04/14/2022 1059   MCH 26.6 (L) 04/14/2022 1059   MCHC 32.1 04/14/2022 1059   RDW 12.7 04/14/2022 1059   LYMPHSABS 1,870 04/14/2022 1059   MONOABS 1.3 (H) 04/29/2021 1710   EOSABS 22 04/14/2022 1059   BASOSABS 61 04/14/2022 1059     Parts of this note may have been dictated using voice recognition software. There may be variances in spelling and vocabulary which are unintentional. Not all errors are proofread. Please notify the dino if any discrepancies are noted or if the meaning of any statement is not clear.

## 2024-08-13 NOTE — Patient Instructions (Signed)
 Will recommend the following: Lantus  40 units every day. Increase by 2 units every other day until fasting blood sugar is less than 120. Stay on that dose.  Novolog  carbohydrates ratio 1:8 (divide carbs by 8): 15 min before meals Novolog  correction scale: Use in addition to your meal time/short acting insulin  based on blood sugars as follows:  151 - 175: 1 unit 176 - 200: 2 units 201 - 225: 3 units 226 - 250: 4 units 251 - 275: 5 units 276 - 300: 6 units 301 - 325: 7 units 326 - 350: 8 units 351 - 375: 9 units 376 - 400: 10 units

## 2024-08-14 LAB — MICROALBUMIN / CREATININE URINE RATIO
Creatinine, Urine: 89 mg/dL (ref 20–320)
Microalb Creat Ratio: 40 mg/g{creat} — ABNORMAL HIGH (ref <30)
Microalb, Ur: 3.6 mg/dL

## 2024-08-19 ENCOUNTER — Encounter: Payer: Self-pay | Admitting: "Endocrinology

## 2024-08-27 ENCOUNTER — Ambulatory Visit: Payer: MEDICAID | Admitting: Podiatry

## 2024-08-27 DIAGNOSIS — B351 Tinea unguium: Secondary | ICD-10-CM

## 2024-08-27 DIAGNOSIS — M79674 Pain in right toe(s): Secondary | ICD-10-CM

## 2024-08-27 DIAGNOSIS — M79675 Pain in left toe(s): Secondary | ICD-10-CM | POA: Diagnosis not present

## 2024-08-27 DIAGNOSIS — B353 Tinea pedis: Secondary | ICD-10-CM

## 2024-08-27 DIAGNOSIS — E1042 Type 1 diabetes mellitus with diabetic polyneuropathy: Secondary | ICD-10-CM | POA: Diagnosis not present

## 2024-08-27 MED ORDER — CLOTRIMAZOLE-BETAMETHASONE 1-0.05 % EX CREA: 1.0000 | TOPICAL_CREAM | Freq: Every day | CUTANEOUS | 2 refills | Status: AC

## 2024-08-27 NOTE — Progress Notes (Unsigned)
 Subjective:  Patient ID: Keith [name], male    DOB: July 15, 2001,  MRN: [id number]  Keith [name] presents to clinic today for:  Chief Complaint  Patient presents with   [facility]    [facility], onychomycosis  A1c Pt stated 12.4 . No anti coag   Patient notes nails are thick, discolored, elongated and painful in shoegear when trying to ambulate.  He notes the skin is very dry bilateral.  Does note some itching with this.  States that his legs and feet get swollen as the day goes on.  His father is with him today.  He does not currently wear any compression devices.  His most recent A1c is high indicating uncontrolled diabetes.  He was recently admitted to the hospital in October for diabetic ketoacidosis.  PCP is Sanger, Dallas Sharper, DO.  Past Medical History:  Diagnosis Date   Anxiety    Asthma    Auditory hallucinations    Cannabis hyperemesis syndrome concurrent with and due to cannabis abuse    Depression    Diabetes mellitus without complication (HCC) 08/03/2015   New onset   Obesity    PTSD (post-traumatic stress disorder)    Social problem 08/03/2015   Unintentional weight loss 06/07/2017   Past Surgical History:  Procedure Laterality Date   FRACTURE SURGERY     Right arm   Allergies[1]  Review of Systems: Negative except as noted in the HPI.  Objective:  Keith [name] is a pleasant 23 y.o. male in NAD. AAO x 3.  Vascular Examination: Capillary refill time is 3-5 seconds to toes bilateral. Palpable pedal pulses b/l LE. Digital hair present b/l.  Skin temperature gradient WNL b/l. No varicosities b/l. No cyanosis noted b/l.  No appreciable edema on exam.  Dermatological Examination: No open wounds. No interdigital macerations b/l. Toenails x10 are 3mm thick, discolored, dystrophic with subungual debris. There is pain with compression of the nail plates.  They are elongated x10 peeling there is dry, scaly skin on the plantar aspect of both feet in a moccasin type  distribution with minimal diffuse erythema present.  No deep fissures are noted     Latest Ref Rng & Units 08/13/2024    1:46 PM  Hemoglobin A1C  Hemoglobin-A1c 4.0 - 5.6 % <15    Assessment/Plan: 1. Pain due to onychomycosis of toenails of both feet   2. Tinea pedis of both feet   3. Type 1 DM with polyneuropathy (HCC)     Meds ordered this encounter  Medications   clotrimazole -betamethasone  (LOTRISONE ) cream    Sig: Apply 1 Application topically daily.    Dispense:  30 g    Refill:  2   The mycotic toenails were sharply debrided x10 with sterile nail nippers and a power debriding burr to decrease bulk/thickness and length.    Prescription for Lotrisone  cream was sent in for the bilateral tinea pedis.  Since the patient had mentioned swelling that gets worse throughout the day, he was given a brochure for the elastic outlet therapy store to purchase knee-high compression socks to wear for edema management.  Return in about 6 months (around 02/25/2025) for [facility].   Keith [name], DPM, FACFAS [facility]     [address]  [place], [place] [address]                Office [phone]  Fax [phone]    [1]  Allergies Allergen Reactions   Amoxicillin  Hives and Rash   Penicillins Hives and Rash    Has patient had a PCN reaction causing immediate rash, facial/tongue/throat swelling, SOB or lightheadedness with hypotension: Yes Has patient had a PCN reaction causing severe rash involving mucus membranes or skin necrosis: Yes Has patient had a PCN reaction that required hospitalization: No Has patient had a PCN reaction occurring within the last 10 years: Yes If all of the above answers are NO, then may proceed with Cephalosporin use.

## 2024-09-09 ENCOUNTER — Encounter: Payer: MEDICAID | Admitting: Nutrition

## 2024-09-22 ENCOUNTER — Encounter: Payer: MEDICAID | Attending: "Endocrinology | Admitting: Nutrition

## 2024-09-22 DIAGNOSIS — E1042 Type 1 diabetes mellitus with diabetic polyneuropathy: Secondary | ICD-10-CM | POA: Diagnosis present

## 2024-09-22 DIAGNOSIS — E1065 Type 1 diabetes mellitus with hyperglycemia: Secondary | ICD-10-CM

## 2024-09-22 MED ORDER — OMNIPOD 5 DEXG7G6 INTRO GEN 5 KIT: 1.0000 | PACK | 0 refills | Status: AC

## 2024-09-22 MED ORDER — OMNIPOD 5 G7 PODS (GEN 5) MISC: 1.0000 | 6 refills | Status: AC

## 2024-09-22 NOTE — Patient Instructions (Signed)
 Charge PDM, turn PDM on, take a picture of the QR code, and set up an OmniPod user name a password.   Write this password down, bring insulin  and PDM, pods and starter box with you for training. Take no Lantus  insulin  the night before training and none that morning.

## 2024-09-22 NOTE — Progress Notes (Signed)
 Patient is here with his grandfather.  He says that his pump was stolen, and that [employer] will not replace this.  He is using Lantus  and says injects the same amount each day at the same time, but blood sugars are very variable.  We discussed the OmniPod that can be paid for through his pharmacy benefits.  He is willing to try this.  Pump was ordered per Dr. Elliot permission, and another appointment was made for next week for training.  Stress the need to still have to bolus for all meals and snacks, do correction doses every time blood sugar goes over 250, and change the pod out every 3 days.  He says he will do this and had no final questions.

## 2024-10-01 ENCOUNTER — Encounter: Payer: MEDICAID | Admitting: Nutrition

## 2024-10-01 ENCOUNTER — Telehealth: Payer: Self-pay | Admitting: Nutrition

## 2024-10-01 NOTE — Telephone Encounter (Signed)
 Patient's grandfather called saying he had surgery this week, and is not able to keep his appointment to start his grandson back on his insulin  pump.  Says his daughter can bring him next Monday, but my schedule was full.  Other dates given for that week, and patient will check with daughter to see if she can bring patient to another one of those times.

## 2024-10-13 ENCOUNTER — Encounter: Payer: MEDICAID | Admitting: Nutrition

## 2024-10-20 ENCOUNTER — Encounter: Payer: MEDICAID | Admitting: Nutrition

## 2024-10-22 ENCOUNTER — Other Ambulatory Visit: Payer: Self-pay

## 2024-10-22 ENCOUNTER — Telehealth: Payer: Self-pay | Admitting: Nutrition

## 2024-10-22 ENCOUNTER — Encounter: Payer: MEDICAID | Admitting: Nutrition

## 2024-10-22 DIAGNOSIS — E1065 Type 1 diabetes mellitus with hyperglycemia: Secondary | ICD-10-CM

## 2024-10-22 DIAGNOSIS — E1042 Type 1 diabetes mellitus with diabetic polyneuropathy: Secondary | ICD-10-CM

## 2024-10-22 MED ORDER — INSULIN LISPRO 100 UNIT/ML IJ SOLN: INTRAMUSCULAR | 2 refills | Status: AC

## 2024-10-22 NOTE — Patient Instructions (Signed)
 Stop taking Lantus  insulin  Read over pump starter booklet today! Give a bolus for all meals and snacks including blood sugar reading into the bolus calculator Do a correction dose any time the reading is over 250mg . Change bolus as soon as it is empty Call Dexcom if problems/questions with Dexcom sensor Call OmniPod help line if questions about how to work you pump

## 2024-10-22 NOTE — Progress Notes (Signed)
 Patient is here with his grandfather to start/be trained on the OmniPod insulin  pump.  He is currently taking 40u of Lantus , and 89u of Humalog  per day, and says his blood sugars are all high.  TDD is 131u.  Basal rate was set to 2.7u /hr.  ISF: 25 and I/C: 8.  Target: 120 with correction over 140.  Max basal: 5.4u/hr, max bolus 30.  He set up his OmniPod UN: [email or web address],and his UN: [id number].  This was linked to [facility] and to [facility].  He was shown how to bolus,  and how and when to do a correction dose.  HE reported good understanding of this and did a re demonstration of this correction without hesitation.   He filled a pod with Humalog  insulin  and applied this to his right abdomen.  He started a dexcom sensor on his phone and his PDM.  His blood sugar was 298 and rising.  Said he took 45u 2 hours ago We discussed need to change pod when empty, how to do this, and give a bolus for all meals and snack-including his blood sugar reading into each bolus calculation, and to do a correction dose any time is blood sugar is over 250.  He reported good understanding of this with no final questions.

## 2024-10-22 NOTE — Telephone Encounter (Signed)
 Ominpod and humalog  ordered.

## 2024-10-22 NOTE — Telephone Encounter (Signed)
 Patient has started on his OmniPod 5 pump.  He is using 131u/day.  Please order pods for use with G7 sensors every 2 days, and his Humalog  in a vial.  Thank you

## 2024-10-29 ENCOUNTER — Encounter: Payer: MEDICAID | Admitting: Nutrition

## 2024-11-13 ENCOUNTER — Ambulatory Visit: Payer: MEDICAID | Admitting: "Endocrinology

## 2025-02-25 ENCOUNTER — Ambulatory Visit: Payer: MEDICAID | Admitting: Podiatry
